# Patient Record
Sex: Female | Born: 1965
Health system: Southern US, Community
[De-identification: ages and names within clinical notes are randomized; demographics above are authoritative.]

## PROBLEM LIST (undated history)

## (undated) DIAGNOSIS — E669 Obesity, unspecified: Secondary | ICD-10-CM

## (undated) DIAGNOSIS — D649 Anemia, unspecified: Secondary | ICD-10-CM

## (undated) DIAGNOSIS — T8859XA Other complications of anesthesia, initial encounter: Secondary | ICD-10-CM

## (undated) DIAGNOSIS — I499 Cardiac arrhythmia, unspecified: Secondary | ICD-10-CM

## (undated) DIAGNOSIS — M1990 Unspecified osteoarthritis, unspecified site: Secondary | ICD-10-CM

## (undated) DIAGNOSIS — T4145XA Adverse effect of unspecified anesthetic, initial encounter: Secondary | ICD-10-CM

## (undated) DIAGNOSIS — K219 Gastro-esophageal reflux disease without esophagitis: Secondary | ICD-10-CM

## (undated) DIAGNOSIS — I493 Ventricular premature depolarization: Secondary | ICD-10-CM

## (undated) HISTORY — PX: COLONOSCOPY: SHX174

## (undated) HISTORY — DX: Obesity, unspecified: E66.9

## (undated) HISTORY — DX: Ventricular premature depolarization: I49.3

---

## 2011-04-10 ENCOUNTER — Ambulatory Visit (HOSPITAL_BASED_OUTPATIENT_CLINIC_OR_DEPARTMENT_OTHER)
Admission: RE | Admit: 2011-04-10 | Discharge: 2011-04-10 | Disposition: A | Discharge status: Home / Self Care | Payer: BC Managed Care – PPO | Source: Ambulatory Visit | Attending: Family Medicine | Admitting: Family Medicine

## 2011-04-10 ENCOUNTER — Other Ambulatory Visit (HOSPITAL_BASED_OUTPATIENT_CLINIC_OR_DEPARTMENT_OTHER): Payer: Self-pay | Admitting: Family Medicine

## 2011-04-10 DIAGNOSIS — M79609 Pain in unspecified limb: Secondary | ICD-10-CM

## 2011-04-10 DIAGNOSIS — M79604 Pain in right leg: Secondary | ICD-10-CM

## 2011-04-10 DIAGNOSIS — Z79899 Other long term (current) drug therapy: Secondary | ICD-10-CM | POA: Insufficient documentation

## 2012-07-24 ENCOUNTER — Other Ambulatory Visit: Payer: Self-pay | Admitting: Sports Medicine

## 2012-07-24 ENCOUNTER — Ambulatory Visit (INDEPENDENT_AMBULATORY_CARE_PROVIDER_SITE_OTHER): Payer: BC Managed Care – PPO

## 2012-07-24 DIAGNOSIS — M948X9 Other specified disorders of cartilage, unspecified sites: Secondary | ICD-10-CM

## 2012-07-24 DIAGNOSIS — T148XXA Other injury of unspecified body region, initial encounter: Secondary | ICD-10-CM

## 2012-07-24 DIAGNOSIS — M25569 Pain in unspecified knee: Secondary | ICD-10-CM

## 2016-04-10 ENCOUNTER — Telehealth: Payer: Self-pay | Admitting: Cardiovascular Disease

## 2016-04-10 NOTE — Telephone Encounter (Signed)
Received records from [facility] for appointment with Dr Kirke CorinArida on 04/25/16.  [facility] for Dr Jari SportsmanArida's schedule on 04/25/16. lp

## 2016-04-25 ENCOUNTER — Encounter: Payer: Self-pay | Admitting: Cardiovascular Disease

## 2016-04-25 ENCOUNTER — Ambulatory Visit (INDEPENDENT_AMBULATORY_CARE_PROVIDER_SITE_OTHER): Payer: BLUE CROSS/BLUE SHIELD | Admitting: Cardiovascular Disease

## 2016-04-25 VITALS — BP 105/68 | HR 68 | Ht 66.0 in | Wt 252.0 lb

## 2016-04-25 DIAGNOSIS — R609 Edema, unspecified: Secondary | ICD-10-CM | POA: Diagnosis not present

## 2016-04-25 DIAGNOSIS — R002 Palpitations: Secondary | ICD-10-CM

## 2016-04-25 DIAGNOSIS — I493 Ventricular premature depolarization: Secondary | ICD-10-CM

## 2016-04-25 MED ORDER — DILTIAZEM HCL ER COATED BEADS 120 MG PO CP24: 120.0000 mg | ORAL_CAPSULE | Freq: Every day | ORAL | Status: DC

## 2016-04-25 NOTE — Patient Instructions (Signed)
Medication Instructions:  Your physician has recommended you make the following change in your medication:  1. STOP long acting Propranolol, Continue as needed short acting Propranolol 2. START Diltiazem ER 120 mg take one capsule by mouth daily  Labwork: No new orders.   Testing/Procedures: Your physician has requested that you have an echocardiogram. Echocardiography is a painless test that uses sound waves to create images of your heart. It provides your doctor with information about the size and shape of your heart and how well your heart's chambers and valves are working. This procedure takes approximately one hour. There are no restrictions for this procedure.  Your physician has recommended that you wear a 48 hour holter monitor. Holter monitors are medical devices that record the heart's electrical activity. Doctors most often use these monitors to diagnose arrhythmias. Arrhythmias are problems with the speed or rhythm of the heartbeat. The monitor is a small, portable device. You can wear one while you do your normal daily activities. This is usually used to diagnose what is causing palpitations/syncope (passing out).  Follow-Up: Your physician recommends that you schedule a follow-up appointment as needed with Dr Kirke CorinArida.   Any Other Special Instructions Will Be Listed Below (If Applicable).     If you need a refill on your cardiac medications before your next appointment, please call your pharmacy.

## 2016-04-25 NOTE — Progress Notes (Signed)
Cardiology Office Note   Date:  04/25/2016   ID:  Dorothy Gibson, DOB 1966-05-05, MRN [id number]  PCP:  Dorothy Rua, MD  Cardiologist:   Dorothy Bears, MD   Chief Complaint  Patient presents with  . New Patient (Initial Visit)    SOB;none. CHEST PAIN;none. LIGHTHEADED/DIZZINESS;none. PAIN OR CRAMPING IN LEGS;none.EDEMA; ankles and hands      History of Present Illness: Dorothy Gibson is a 50 y.o. female who was referred by Dorothy Gibson for evaluation of PVCs, weight gain and leg edema. The patient has no significant chronic medical conditions other than obesity. She was diagnosed with PVCs few years ago. She was treated with metoprolol which did not control her symptoms. She was then treated with propranolol with subsequent improvement but the need to gradually increase the dose. She takes 160 mg extended release at night and 20 mg 3 times daily. Even with this regimen, she continues to complain of palpitations. She describes mild dyspnea but no chest pain, orthopnea or PND. Over the last few years, she has experienced gradual weight gain in spite of being more active and doing more exercise. She also complains of fatigue. There is no history of syncope or presyncope. She consumes minimal amounts of caffeine. There is no history of hyperthyroidism. She is not a smoker and there is no family history of coronary artery disease. Her father died of lung cancer.    History reviewed. No pertinent past medical history.  History reviewed. No pertinent past surgical history.   Current Outpatient Prescriptions  Medication Sig Dispense Refill  . cycloSPORINE (RESTASIS) 0.05 % ophthalmic emulsion Place 1 drop into both eyes 2 (two) times daily.    . Fish Oil-Cholecalciferol (FISH OIL + D3) 1200-1000 MG-UNIT CAPS Take by mouth daily.    . LO LOESTRIN FE 1 MG-10 MCG / 10 MCG tablet Take 1 tablet by mouth daily as needed.  1  . Probiotic Product (PRO-BIOTIC BLEND) CAPS Take by mouth  daily.    . propranolol (INDERAL) 20 MG tablet Take 1 tablet by mouth 3 (three) times daily after meals.  5  . diltiazem (CARDIZEM CD) 120 MG 24 hr capsule Take 1 capsule (120 mg total) by mouth daily. 30 capsule 6   No current facility-administered medications for this visit.    Allergies:   Ivp dye; Neosporin; Feldene; and Latex    Social History:  The patient  reports that she has never smoked. She does not have any smokeless tobacco history on file.   Family History:  The patient's Family history is negative for coronary artery disease. Father died of lung cancer  ROS:  Please see the history of present illness.   Otherwise, review of systems are positive for none.   All other systems are reviewed and negative.    PHYSICAL EXAM: VS:  BP 105/68 mmHg  Pulse 68  Ht  (1.676 m)  Wt 252 lb (114.306 kg)  BMI 40.69 kg/m2 , BMI Body mass index is 40.69 kg/(m^2). GEN: Well nourished, well developed, in no acute distress HEENT: normal Neck: no JVD, carotid bruits, or masses Cardiac: RRR; no murmurs, rubs, or gallops,no edema  Respiratory:  clear to auscultation bilaterally, normal work of breathing GI: soft, nontender, nondistended, + BS MS: no deformity or atrophy Skin: warm and dry, no rash Neuro:  Strength and sensation are intact Psych: euthymic mood, full affect   EKG:  EKG is ordered today. The ekg ordered today demonstrates normal sinus  rhythm with no significant ST or T wave changes.   Recent Labs: No results found for requested labs within last 365 days.    Lipid Panel No results found for: CHOL, TRIG, HDL, CHOLHDL, VLDL, LDLCALC, LDLDIRECT    Wt Readings from Last 3 Encounters:  04/25/16 252 lb (114.306 kg)        ASSESSMENT AND PLAN:  1.  Symptomatic PVCs: By physical exam, she has no premature beats and baseline ECG is also normal. However she continues to complain of frequent palpitations especially during the day. There seems to be an anxiety  component and I reassured her that if the burden of PVCs is low and LV systolic function is normal then these are overall benign. I requested a 48-hour Holter monitor to quantify the PVCs. I also requested an echocardiogram to evaluate LV systolic function and any structural heart abnormalities.  Treatment with beta blockers can be associated with weight gain which she is complaining off. Thus, I elected to switch propranolol to diltiazem extended release 120 mg once daily. She can continue to use short acting propranolol 20 mg as needed throughout the day.  2. Leg edema: No significant edema by exam today. We'll check LV systolic function and pulmonary pressure by echo.      Disposition:   FU with me as needed  Signed,  Dorothy BearsMuhammad Arida, MD  04/25/2016 1:09 PM    [facility]

## 2016-04-26 ENCOUNTER — Telehealth: Payer: Self-pay

## 2016-04-26 ENCOUNTER — Telehealth: Payer: Self-pay | Admitting: Cardiovascular Disease

## 2016-04-26 NOTE — Telephone Encounter (Signed)
Spoke with Dorothy Gibson and she states that she took the Diltiazem 120mg  last night but did not take any Propanolol. Dorothy Gibson had PVC's last night and this morning. Dorothy Gibson wanted to know if ok to take a Propanolol along with the Diltiazem if having the PVC's.   Advised Dorothy Gibson that would be fine. Dorothy Gibson also wanted to know if Dr. Kirke CorinArida felt that she needed a higher dose of the Diltiazem since she had the PVC's? Advised Dorothy Gibson that since this is a new medication he will probably want her to continue at this dose and see how she does over a period of time. Dorothy Gibson asked that I please verify with Dr. Kirke CorinArida. Also advised Dorothy Gibson that tylenol is preferred over ibuprofen. Dorothy Gibson verbalized understanding. Will route to Dr. Kirke CorinArida for review and advisement.

## 2016-04-26 NOTE — Telephone Encounter (Signed)
We need at least few days to evaluate the response before increasing the dose. We also need to see what the Holter monitor shows.

## 2016-04-26 NOTE — Telephone Encounter (Signed)
Patient was in today and states her insurance company needs a "predetermination" code for her upcoming Holter monitor.  Please call [employer] [phone] with this information.  This insurance company states they tried to reach Dorothy Gibson (no last name) but were unsuccessful.

## 2016-04-26 NOTE — Telephone Encounter (Signed)
New message   Pt C/O medication issue:  1. Name of Medication: diltiazem 120 mg   2. How are you currently taking this medication (dosage and times per day)? Started new medication    3. Are you having a reaction (difficulty breathing--STAT)? No   4. What is your medication issue? Wants to know can she take propranolol 20 mg at night with diltiazem 120 mg  - last night  PVC just took  one 120 mg  / woke up this am with PVC.    Patient also wants to know about Ibuprofen and tylenol

## 2016-04-27 NOTE — Telephone Encounter (Signed)
#   for [employer] is the generic Member line.  I called the patient's cell phone and gave her [phone] and [phone] for a 48 hour holter monitor. I also gave her the ICD10 for PCVS which is what I think they need to look at [facility] medical necessity list.  We don't have codes for "pre determination"  She was appreciative and I gave her my direct # incase she needed anything else.

## 2016-04-27 NOTE — Telephone Encounter (Signed)
I spoke with the pt and made her aware of Dr Kirke CorinArida' comments.  The pt said she took her Diltiazem last night and did not have any palpitations. I advised her to give the medication time to get in her system and then we can make adjustments if she continues to have symptoms. Pt agreed with plan.

## 2016-04-27 NOTE — Telephone Encounter (Signed)
This message will be forwarded to our Billing/Precert department to address.

## 2016-05-11 ENCOUNTER — Other Ambulatory Visit (HOSPITAL_COMMUNITY): Payer: BLUE CROSS/BLUE SHIELD

## 2016-05-16 ENCOUNTER — Ambulatory Visit (HOSPITAL_COMMUNITY): Payer: BLUE CROSS/BLUE SHIELD | Attending: Cardiovascular Disease

## 2016-05-16 ENCOUNTER — Ambulatory Visit (INDEPENDENT_AMBULATORY_CARE_PROVIDER_SITE_OTHER): Payer: BLUE CROSS/BLUE SHIELD

## 2016-05-16 ENCOUNTER — Other Ambulatory Visit: Payer: Self-pay

## 2016-05-16 DIAGNOSIS — I517 Cardiomegaly: Secondary | ICD-10-CM | POA: Insufficient documentation

## 2016-05-16 DIAGNOSIS — R609 Edema, unspecified: Secondary | ICD-10-CM | POA: Insufficient documentation

## 2016-05-16 DIAGNOSIS — I493 Ventricular premature depolarization: Secondary | ICD-10-CM

## 2016-05-16 DIAGNOSIS — E669 Obesity, unspecified: Secondary | ICD-10-CM | POA: Diagnosis not present

## 2016-05-16 DIAGNOSIS — I313 Pericardial effusion (noninflammatory): Secondary | ICD-10-CM | POA: Insufficient documentation

## 2016-05-16 DIAGNOSIS — Z6841 Body Mass Index (BMI) 40.0 and over, adult: Secondary | ICD-10-CM | POA: Diagnosis not present

## 2016-05-16 DIAGNOSIS — R002 Palpitations: Secondary | ICD-10-CM | POA: Diagnosis not present

## 2016-05-18 ENCOUNTER — Telehealth: Payer: Self-pay | Admitting: Cardiovascular Disease

## 2016-05-18 NOTE — Telephone Encounter (Signed)
Follow-up     The pt is calling back to get Echo results, and making sure if the pt still needs to meet with the doctor next week.

## 2016-05-18 NOTE — Telephone Encounter (Signed)
Spoke with pt and went over echo results. Pt wanted to know if she should keep her appt. Advised her f/u was as needed so I could cancel if she wanted me to. Pt currently wearing 48 hr holter with plan to remove today.  Pt wanted to cancel appt for 6/6 and see what monitor results showed and make an appt at that time if it was necessary. Cancelled appt and advised pt we will contact her with those results once available. Pt verbalized understanding and was in agreement with this plan.

## 2016-05-23 ENCOUNTER — Ambulatory Visit: Payer: BLUE CROSS/BLUE SHIELD | Admitting: Cardiovascular Disease

## 2016-05-26 ENCOUNTER — Telehealth: Payer: Self-pay | Admitting: Cardiovascular Disease

## 2016-05-26 DIAGNOSIS — I493 Ventricular premature depolarization: Secondary | ICD-10-CM

## 2016-05-26 DIAGNOSIS — R609 Edema, unspecified: Secondary | ICD-10-CM

## 2016-05-26 NOTE — Telephone Encounter (Signed)
Left message on machine for pt to contact the office.   

## 2016-05-26 NOTE — Telephone Encounter (Signed)
Mrs. Dorothy Gibson is calling about her holter monitor results and she has had two episodes of her heart feeling heavy and like she is going to pass out . Please call

## 2016-05-29 NOTE — Telephone Encounter (Signed)
See Holter result note.  

## 2016-05-30 MED ORDER — DILTIAZEM HCL ER COATED BEADS 180 MG PO CP24: 180.0000 mg | ORAL_CAPSULE | Freq: Every day | ORAL | Status: DC

## 2016-05-30 NOTE — Telephone Encounter (Signed)
Reviewed results of holter monitor at lenght with pt.  She is aware that further testing is needed to r/o ischemia.  Reviewed the myoview instructions and orders to increase Diltiazem to 180 mg.  Answered multiple questions for pt and she seemed to feel more at ease after our conversation.  She is aware someone will be calling to schedule her stress test and f/u appt.

## 2016-05-30 NOTE — Telephone Encounter (Signed)
Reviewed with Dr.Arida and pt should hold Diltiazem and prn Propanolol the day of stress test. I placed call to pt and left message to call back

## 2016-05-31 ENCOUNTER — Telehealth: Payer: Self-pay | Admitting: *Deleted

## 2016-05-31 NOTE — Telephone Encounter (Signed)
Patient walk-in to office Patient  had question  In regards to taking  The propanolol prn ,if the diltiazem had been increased to 180 from 120 mg Also had question concerning 2 day stress test.  RN informed continue to use if needed - symptoms lasting more than 3 -6 hours, not if she has fleeting palpation We the increase medication she may not have the issue.  RN and Almira CoasterGina ( nuc tech.) answered her question concerning testing.  Informed patient will receive a call prior to test with more instruction  Patient verbalized understanding

## 2016-06-14 ENCOUNTER — Telehealth: Payer: Self-pay | Admitting: Cardiovascular Disease

## 2016-06-14 NOTE — Telephone Encounter (Signed)
New Message:   Pt says her primary doctor thinks she is having an allergic reaction to her Diltiazem,please call.

## 2016-06-14 NOTE — Telephone Encounter (Signed)
PT  CALLED  NOTES  ITCHING  RASH TO LIMBS,FACE AND  TRUNK   ALSO  HAS  BEEN CONSTIPATED the patient WAS  SEEN BY PRIMARY PA  WHOM THINKS  CULPRIT  IS  DILTIAZEM . PT    MEDS WERE CHANGED  PA  STOPPED  DILTIAZEM AND  RESTARTED  INDERAL  ER  160 MG  .DILTIAZEM WAS RECENTLY  INCREASED BASED  ON  FINDINGS OF HOLTER .WILL FORWARD TO  DR  Kirke CorinARIDA  FOR  REVIEW AND  RECOMMENDATIONS .Zack Seal/CY

## 2016-06-15 ENCOUNTER — Telehealth (HOSPITAL_COMMUNITY): Payer: Self-pay | Admitting: *Deleted

## 2016-06-15 NOTE — Telephone Encounter (Signed)
Patient given detailed instructions per Myocardial Perfusion Study Information Sheet for the test on 06/21/16 at 1230. Patient notified to arrive 15 minutes early and that it is imperative to arrive on time for appointment to keep from having the test rescheduled.  If you need to cancel or reschedule your appointment, please call the office within 24 hours of your appointment. Failure to do so may result in a cancellation of your appointment, and a $50 no show fee. Patient verbalized understanding.Hasspacher, Cynthia W     

## 2016-06-15 NOTE — Telephone Encounter (Signed)
I agree with switching back to Inderal due to reaction. I can reevaluate her during follow up.

## 2016-06-15 NOTE — Telephone Encounter (Signed)
Patient had multiple questions in reference to her allergic reaction to Diltiazem. As documented in note to C.York,LPN whom forwarded this onto Dr. Kirke CorinArida. Patient has not heard from Dr. Kirke CorinArida yet. Patient stated her rash is some better, no breathing issues. I informed patient to take Benadryl in place of Zyrtec. I informed patient to continue what her primary instructed her to do until she hears back from Dr. Kirke CorinArida office.Hasspacher, Adelene IdlerCynthia W

## 2016-06-16 NOTE — Telephone Encounter (Signed)
Patient stated rash much better

## 2016-06-16 NOTE — Telephone Encounter (Signed)
Follow-up    The pt was started on Propranolol    Pt c/o medication issue:  1. Name of Medication: Propranolol  2. How are you currently taking this medication (dosage and times per day)? 160 mg po daily  3. Are you having a reaction (difficulty breathing--STAT)? no  4. What is your medication issue? The pt wanted to know it is ok to continue to keeping taking the new medication cause she was taking diltiazem and she got a rash all over

## 2016-06-16 NOTE — Telephone Encounter (Signed)
Left message to call back  

## 2016-06-16 NOTE — Telephone Encounter (Signed)
Advised patient and she will continue Inderal 160 mg daily

## 2016-06-16 NOTE — Telephone Encounter (Signed)
Keep follow up as scheduled.

## 2016-06-21 ENCOUNTER — Ambulatory Visit (HOSPITAL_COMMUNITY): Payer: BLUE CROSS/BLUE SHIELD | Attending: Cardiovascular Disease

## 2016-06-21 DIAGNOSIS — R002 Palpitations: Secondary | ICD-10-CM | POA: Insufficient documentation

## 2016-06-21 DIAGNOSIS — I493 Ventricular premature depolarization: Secondary | ICD-10-CM | POA: Insufficient documentation

## 2016-06-21 DIAGNOSIS — R0609 Other forms of dyspnea: Secondary | ICD-10-CM | POA: Insufficient documentation

## 2016-06-21 MED ORDER — TECHNETIUM TC 99M TETROFOSMIN IV KIT
32.7000 | PACK | Freq: Once | INTRAVENOUS | Status: AC | PRN
  Administered 2016-06-21: 32.7 via INTRAVENOUS
  Filled 2016-06-21: qty 33

## 2016-06-22 ENCOUNTER — Ambulatory Visit (HOSPITAL_COMMUNITY): Payer: BLUE CROSS/BLUE SHIELD | Attending: Internal Medicine

## 2016-06-22 LAB — MYOCARDIAL PERFUSION IMAGING
Estimated workload: 8.9 METS
Exercise duration (min): 7 min
Exercise duration (sec): 15 s
LV dias vol: 121 mL (ref 46–106)
LV sys vol: 55 mL
MPHR: 169 {beats}/min
Peak HR: 160 {beats}/min
Percent HR: 95 %
RATE: 0.41
RPE: 19
Rest HR: 68 {beats}/min
SDS: 7
SRS: 5
SSS: 12
TID: 0.87

## 2016-06-22 MED ORDER — TECHNETIUM TC 99M TETROFOSMIN IV KIT
32.2000 | PACK | Freq: Once | INTRAVENOUS | Status: AC | PRN
  Administered 2016-06-22: 32.2 via INTRAVENOUS
  Filled 2016-06-22: qty 32

## 2016-06-26 ENCOUNTER — Telehealth: Payer: Self-pay | Admitting: Cardiovascular Disease

## 2016-06-26 NOTE — Telephone Encounter (Signed)
Follow Up:  Returning call from this morning,she does know who called.

## 2016-06-26 NOTE — Telephone Encounter (Signed)
PT AWARE OF MYOVIEW RESULTS./CY 

## 2016-06-26 NOTE — Telephone Encounter (Signed)
F/U  Pt returning Dorothy Gibson's phone call. Please call.

## 2016-07-11 ENCOUNTER — Ambulatory Visit: Payer: BLUE CROSS/BLUE SHIELD | Admitting: Cardiovascular Disease

## 2016-08-15 ENCOUNTER — Encounter (INDEPENDENT_AMBULATORY_CARE_PROVIDER_SITE_OTHER): Payer: Self-pay

## 2016-08-15 ENCOUNTER — Ambulatory Visit (INDEPENDENT_AMBULATORY_CARE_PROVIDER_SITE_OTHER): Payer: BLUE CROSS/BLUE SHIELD | Admitting: Cardiovascular Disease

## 2016-08-15 VITALS — BP 104/68 | HR 70 | Ht 66.0 in | Wt 255.0 lb

## 2016-08-15 DIAGNOSIS — I493 Ventricular premature depolarization: Secondary | ICD-10-CM | POA: Diagnosis not present

## 2016-08-15 NOTE — Progress Notes (Signed)
Cardiology Office Note   Date:  08/15/2016   ID:  Dorothy Gibson, DOB [birth year], MRN [id number]  PCP:  Ethel Rana  Cardiologist:   Lorine Bears, MD   No chief complaint on file.     History of Present Illness: Dorothy Gibson is a 50 y.o. female who is here today for a follow-up visit regarding palpitations due to PVCs and PACs.  The patient has no significant chronic medical conditions other than obesity. She was diagnosed with PVCs few years ago. She was treated with metoprolol which did not control her symptoms. She was then treated with propranolol with subsequent improvement but the need to gradually increase the dose. She takes 160 mg extended release at night and 20 mg 3 times daily.  I proceeded with an echocardiogram in May which showed an ejection fraction of 50-55%, normal diastolic function and trivial pericardial effusion. Holter monitor showed occasional PVCs and PACs. She had a total of 155 beats in 48 hours of presenting 0.1%. There was a 7 beat run of slow ventricular tachycardia slightly above 100 bpm. There was also mild PACs. I proceeded with a nuclear stress test which showed an ejection fraction of 55% with no evidence of perfusion defects. Initially she was switched from propranolol to diltiazem due to concerns about weight gain. However, she developed a rash with diltiazem and was placed back on propranolol. She reports that her symptoms are overall better than before.   Past Medical History:  Diagnosis Date  . Obesity   . PVC's (premature ventricular contractions)     No past surgical history on file.   Current Outpatient Prescriptions  Medication Sig Dispense Refill  . cycloSPORINE (RESTASIS) 0.05 % ophthalmic emulsion Place 1 drop into both eyes 2 (two) times daily.    . Fish Oil-Cholecalciferol (FISH OIL + D3) 1200-1000 MG-UNIT CAPS Take by mouth daily.    . LO LOESTRIN FE 1 MG-10 MCG / 10 MCG tablet Take 1 tablet by mouth daily as  needed.  1  . Probiotic Product (PRO-BIOTIC BLEND) CAPS Take by mouth daily.    . propranolol (INDERAL) 20 MG tablet Take 20 mg by mouth as needed.    . propranolol ER (INDERAL LA) 160 MG SR capsule Take 160 mg by mouth daily.     No current facility-administered medications for this visit.     Allergies:   Diltiazem; Ivp dye [iodinated diagnostic agents]; Neosporin [neomycin-bacitracin zn-polymyx]; Feldene [piroxicam]; and Latex    Social History:  The patient  reports that she has never smoked. She does not have any smokeless tobacco history on file.   Family History:  The patient's Family history is negative for coronary artery disease. Father died of lung cancer  ROS:  Please see the history of present illness.   Otherwise, review of systems are positive for none.   All other systems are reviewed and negative.    PHYSICAL EXAM: VS:  BP 104/68   Pulse 70   Ht 5\' 6"  (1.676 m)   Wt 255 lb (115.7 kg)   BMI 41.16 kg/m  , BMI Body mass index is 41.16 kg/m. GEN: Well nourished, well developed, in no acute distress HEENT: normal Neck: no JVD, carotid bruits, or masses Cardiac: RRR; no murmurs, rubs, or gallops,no edema  Respiratory:  clear to auscultation bilaterally, normal work of breathing GI: soft, nontender, nondistended, + BS MS: no deformity or atrophy Skin: warm and dry, no rash Neuro:  Strength and sensation are  intact Psych: euthymic mood, full affect   EKG:  EKG is ordered today. The ekg ordered today demonstrates normal sinus rhythm with no significant ST or T wave changes.   Recent Labs: No results found for requested labs within last 8760 hours.    Lipid Panel No results found for: CHOL, TRIG, HDL, CHOLHDL, VLDL, LDLCALC, LDLDIRECT    Wt Readings from Last 3 Encounters:  08/15/16 255 lb (115.7 kg)  06/21/16 252 lb (114.3 kg)  04/25/16 252 lb (114.3 kg)        ASSESSMENT AND PLAN:  1.  Symptomatic PVCs:  The patient was found to have mild PVCs and  PACs overall not significant enough to be worrisome. Her symptoms are reasonably controlled with propranolol. She had a rash with diltiazem. Echocardiogram showed no structural heart disease and stress test showed no evidence of ischemia.  2. Leg edema: This has resolved.     Disposition:   FU with me as needed  Signed,  Lorine BearsMuhammad Arida, MD  08/15/2016 5:47 PM    [facility]

## 2016-08-15 NOTE — Patient Instructions (Signed)

## 2016-11-06 DIAGNOSIS — E669 Obesity, unspecified: Secondary | ICD-10-CM | POA: Diagnosis not present

## 2016-11-27 DIAGNOSIS — Z6841 Body Mass Index (BMI) 40.0 and over, adult: Secondary | ICD-10-CM | POA: Diagnosis not present

## 2016-11-27 DIAGNOSIS — Z01419 Encounter for gynecological examination (general) (routine) without abnormal findings: Secondary | ICD-10-CM | POA: Diagnosis not present

## 2016-12-20 DIAGNOSIS — R609 Edema, unspecified: Secondary | ICD-10-CM | POA: Diagnosis not present

## 2017-01-06 DIAGNOSIS — Z1231 Encounter for screening mammogram for malignant neoplasm of breast: Secondary | ICD-10-CM | POA: Diagnosis not present

## 2017-01-22 DIAGNOSIS — E669 Obesity, unspecified: Secondary | ICD-10-CM | POA: Diagnosis not present

## 2017-01-23 DIAGNOSIS — R12 Heartburn: Secondary | ICD-10-CM | POA: Diagnosis not present

## 2017-01-23 DIAGNOSIS — R609 Edema, unspecified: Secondary | ICD-10-CM | POA: Diagnosis not present

## 2017-04-10 DIAGNOSIS — E669 Obesity, unspecified: Secondary | ICD-10-CM | POA: Diagnosis not present

## 2017-04-25 DIAGNOSIS — M25571 Pain in right ankle and joints of right foot: Secondary | ICD-10-CM | POA: Diagnosis not present

## 2017-04-25 DIAGNOSIS — R609 Edema, unspecified: Secondary | ICD-10-CM | POA: Diagnosis not present

## 2017-04-25 DIAGNOSIS — M25572 Pain in left ankle and joints of left foot: Secondary | ICD-10-CM | POA: Diagnosis not present

## 2017-04-25 DIAGNOSIS — K219 Gastro-esophageal reflux disease without esophagitis: Secondary | ICD-10-CM | POA: Diagnosis not present

## 2017-05-03 DIAGNOSIS — M722 Plantar fascial fibromatosis: Secondary | ICD-10-CM | POA: Diagnosis not present

## 2017-05-10 DIAGNOSIS — M722 Plantar fascial fibromatosis: Secondary | ICD-10-CM | POA: Diagnosis not present

## 2017-06-12 DIAGNOSIS — E669 Obesity, unspecified: Secondary | ICD-10-CM | POA: Diagnosis not present

## 2017-07-17 DIAGNOSIS — B379 Candidiasis, unspecified: Secondary | ICD-10-CM | POA: Diagnosis not present

## 2017-07-17 DIAGNOSIS — H6123 Impacted cerumen, bilateral: Secondary | ICD-10-CM | POA: Diagnosis not present

## 2017-11-22 DIAGNOSIS — R194 Change in bowel habit: Secondary | ICD-10-CM | POA: Diagnosis not present

## 2017-11-22 DIAGNOSIS — M549 Dorsalgia, unspecified: Secondary | ICD-10-CM | POA: Diagnosis not present

## 2017-11-22 DIAGNOSIS — K625 Hemorrhage of anus and rectum: Secondary | ICD-10-CM | POA: Diagnosis not present

## 2017-11-22 DIAGNOSIS — K649 Unspecified hemorrhoids: Secondary | ICD-10-CM | POA: Diagnosis not present

## 2017-11-23 DIAGNOSIS — R194 Change in bowel habit: Secondary | ICD-10-CM | POA: Diagnosis not present

## 2018-01-02 DIAGNOSIS — M545 Low back pain: Secondary | ICD-10-CM | POA: Diagnosis not present

## 2018-01-02 DIAGNOSIS — M5126 Other intervertebral disc displacement, lumbar region: Secondary | ICD-10-CM | POA: Diagnosis not present

## 2018-01-07 DIAGNOSIS — R300 Dysuria: Secondary | ICD-10-CM | POA: Diagnosis not present

## 2018-01-07 DIAGNOSIS — Z01411 Encounter for gynecological examination (general) (routine) with abnormal findings: Secondary | ICD-10-CM | POA: Diagnosis not present

## 2018-01-07 DIAGNOSIS — Z01419 Encounter for gynecological examination (general) (routine) without abnormal findings: Secondary | ICD-10-CM | POA: Diagnosis not present

## 2018-01-07 DIAGNOSIS — Z6841 Body Mass Index (BMI) 40.0 and over, adult: Secondary | ICD-10-CM | POA: Diagnosis not present

## 2018-01-09 DIAGNOSIS — M5416 Radiculopathy, lumbar region: Secondary | ICD-10-CM | POA: Diagnosis not present

## 2018-01-09 DIAGNOSIS — M5126 Other intervertebral disc displacement, lumbar region: Secondary | ICD-10-CM | POA: Diagnosis not present

## 2018-01-10 DIAGNOSIS — M5416 Radiculopathy, lumbar region: Secondary | ICD-10-CM | POA: Diagnosis not present

## 2018-01-17 DIAGNOSIS — M5126 Other intervertebral disc displacement, lumbar region: Secondary | ICD-10-CM | POA: Diagnosis not present

## 2018-01-17 DIAGNOSIS — M5416 Radiculopathy, lumbar region: Secondary | ICD-10-CM | POA: Diagnosis not present

## 2018-01-21 DIAGNOSIS — Z1231 Encounter for screening mammogram for malignant neoplasm of breast: Secondary | ICD-10-CM | POA: Diagnosis not present

## 2018-02-11 DIAGNOSIS — M5416 Radiculopathy, lumbar region: Secondary | ICD-10-CM | POA: Diagnosis not present

## 2018-02-15 DIAGNOSIS — M5416 Radiculopathy, lumbar region: Secondary | ICD-10-CM | POA: Diagnosis not present

## 2018-02-19 DIAGNOSIS — M5416 Radiculopathy, lumbar region: Secondary | ICD-10-CM | POA: Diagnosis not present

## 2018-02-22 DIAGNOSIS — M5416 Radiculopathy, lumbar region: Secondary | ICD-10-CM | POA: Diagnosis not present

## 2018-03-04 DIAGNOSIS — B372 Candidiasis of skin and nail: Secondary | ICD-10-CM | POA: Diagnosis not present

## 2018-03-04 DIAGNOSIS — H6123 Impacted cerumen, bilateral: Secondary | ICD-10-CM | POA: Diagnosis not present

## 2018-03-07 ENCOUNTER — Ambulatory Visit: Payer: Self-pay | Admitting: Orthopedic Surgery

## 2018-03-11 DIAGNOSIS — H6123 Impacted cerumen, bilateral: Secondary | ICD-10-CM | POA: Diagnosis not present

## 2018-03-11 DIAGNOSIS — H9190 Unspecified hearing loss, unspecified ear: Secondary | ICD-10-CM | POA: Diagnosis not present

## 2018-03-11 DIAGNOSIS — I872 Venous insufficiency (chronic) (peripheral): Secondary | ICD-10-CM | POA: Diagnosis not present

## 2018-03-13 DIAGNOSIS — M5416 Radiculopathy, lumbar region: Secondary | ICD-10-CM | POA: Diagnosis not present

## 2018-03-13 DIAGNOSIS — M5126 Other intervertebral disc displacement, lumbar region: Secondary | ICD-10-CM | POA: Diagnosis not present

## 2018-03-20 ENCOUNTER — Encounter (HOSPITAL_COMMUNITY): Payer: Self-pay

## 2018-03-20 NOTE — Pre-Procedure Instructions (Signed)
Dorothy HeckLaura Jane Gibson  03/20/2018      [id number] - [place], [place] - [address] [place]  [id number] Phone: [phone] Fax: [phone]    Your procedure is scheduled on  Thursday 03/28/18  [facility].M.  Call this number if you have problems the morning of surgery:  [phone]   Remember:  Do not eat food or drink liquids after midnight.  Take these medicines the morning of surgery with A SIP OF WATER - GABAPENTIN (NEURONTIN), OMEPRAZOLE (PRILOSEC), PROPRANOLOL(inderal)  7 days prior to surgery STOP taking any Aspirin(unless otherwise instructed by your surgeon), Aleve, Naproxen, Ibuprofen, Motrin, Advil, Goody's, BC's, all herbal medications, fish oil, and all vitamins   Do not wear jewelry, make-up or nail polish.  Do not wear lotions, powders, or perfumes, or deodorant.  Do not shave 48 hours prior to surgery.  Men may shave face and neck.  Do not bring valuables to the hospital.  [facility] not [facility] for any belongings or valuables.  Contacts, dentures or bridgework may not be worn into surgery.  Leave your suitcase in the car.  After surgery it may be brought to your room.  For patients admitted to the hospital, discharge time will be determined by your treatment team.  Patients discharged the day of surgery will not be allowed to drive home.   Name and phone number of your driver:   Special instructions:  [employer] - Preparing for Surgery  Before surgery, you can play an important role.  Because skin is not sterile, your skin needs to be as free of germs as possible.  You can reduce the number of germs on you skin by washing with CHG (chlorahexidine gluconate) soap before surgery.  CHG is an antiseptic cleaner which kills germs and bonds with the skin to continue killing germs even after washing.  Please DO NOT use if you have an allergy to CHG or antibacterial  soaps.  If your skin becomes reddened/irritated stop using the CHG and inform your nurse when you arrive at Short Stay.  Do not shave (including legs and underarms) for at least 48 hours prior to the first CHG shower.  You may shave your face.  Please follow these instructions carefully:   1.  Shower with CHG Soap the night before surgery and the                                morning of Surgery.  2.  If you choose to wash your hair, wash your hair first as usual with your       normal shampoo.  3.  After you shampoo, rinse your hair and body thoroughly to remove the                      Shampoo.  4.  Use CHG as you would any other liquid soap.  You can apply chg directly       to the skin and wash gently with scrungie or a clean washcloth.  5.  Apply the CHG Soap to your body ONLY FROM THE NECK DOWN.        Do not use on open wounds or open sores.  Avoid contact with your eyes,       ears, mouth and genitals (private parts).  Wash genitals (private parts)       with your normal soap.  6.  Wash thoroughly, paying special attention to the area where your surgery        will be performed.  7.  Thoroughly rinse your body with warm water from the neck down.  8.  DO NOT shower/wash with your normal soap after using and rinsing off       the CHG Soap.  9.  Pat yourself dry with a clean towel.            10.  Wear clean pajamas.            11.  Place clean sheets on your bed the night of your first shower and do not        sleep with pets.  Day of Surgery  Do not apply any lotions/deoderants the morning of surgery.  Please wear clean clothes to the hospital/surgery center.     Please read over the following fact sheets that you were given. MRSA Information and Surgical Site Infection Prevention

## 2018-03-21 ENCOUNTER — Ambulatory Visit (HOSPITAL_COMMUNITY)
Admission: RE | Admit: 2018-03-21 | Discharge: 2018-03-21 | Disposition: A | Discharge status: Home / Self Care | Payer: BLUE CROSS/BLUE SHIELD | Source: Ambulatory Visit | Attending: Orthopedic Surgery | Admitting: Orthopedic Surgery

## 2018-03-21 ENCOUNTER — Other Ambulatory Visit: Payer: Self-pay

## 2018-03-21 ENCOUNTER — Encounter (HOSPITAL_COMMUNITY): Payer: Self-pay

## 2018-03-21 ENCOUNTER — Encounter (HOSPITAL_COMMUNITY)
Admission: RE | Admit: 2018-03-21 | Discharge: 2018-03-21 | Disposition: A | Discharge status: Home / Self Care | Payer: BLUE CROSS/BLUE SHIELD | Source: Ambulatory Visit | Attending: Specialist | Admitting: Specialist

## 2018-03-21 DIAGNOSIS — Z01818 Encounter for other preprocedural examination: Secondary | ICD-10-CM | POA: Diagnosis not present

## 2018-03-21 DIAGNOSIS — M47816 Spondylosis without myelopathy or radiculopathy, lumbar region: Secondary | ICD-10-CM | POA: Diagnosis not present

## 2018-03-21 DIAGNOSIS — Z01812 Encounter for preprocedural laboratory examination: Secondary | ICD-10-CM | POA: Diagnosis not present

## 2018-03-21 DIAGNOSIS — M5126 Other intervertebral disc displacement, lumbar region: Secondary | ICD-10-CM | POA: Insufficient documentation

## 2018-03-21 HISTORY — DX: Anemia, unspecified: D64.9

## 2018-03-21 HISTORY — DX: Gastro-esophageal reflux disease without esophagitis: K21.9

## 2018-03-21 HISTORY — DX: Unspecified osteoarthritis, unspecified site: M19.90

## 2018-03-21 HISTORY — DX: Other complications of anesthesia, initial encounter: T88.59XA

## 2018-03-21 HISTORY — DX: Cardiac arrhythmia, unspecified: I49.9

## 2018-03-21 HISTORY — DX: Adverse effect of unspecified anesthetic, initial encounter: T41.45XA

## 2018-03-21 LAB — CBC
HCT: 38.3 % (ref 36.0–46.0)
Hemoglobin: 12.3 g/dL (ref 12.0–15.0)
MCH: 31.2 pg (ref 26.0–34.0)
MCHC: 32.1 g/dL (ref 30.0–36.0)
MCV: 97.2 fL (ref 78.0–100.0)
Platelets: 281 10*3/uL (ref 150–400)
RBC: 3.94 MIL/uL (ref 3.87–5.11)
RDW: 13.3 % (ref 11.5–15.5)
WBC: 7 10*3/uL (ref 4.0–10.5)

## 2018-03-21 LAB — SURGICAL PCR SCREEN
MRSA, PCR: NEGATIVE
Staphylococcus aureus: NEGATIVE

## 2018-03-21 LAB — BASIC METABOLIC PANEL
Anion gap: 10 (ref 5–15)
BUN: 14 mg/dL (ref 6–20)
CO2: 24 mmol/L (ref 22–32)
Calcium: 9.5 mg/dL (ref 8.9–10.3)
Chloride: 105 mmol/L (ref 101–111)
Creatinine, Ser: 0.81 mg/dL (ref 0.44–1.00)
GFR calc Af Amer: >60 mL/min (ref >60)
GFR calc non Af Amer: >60 mL/min (ref >60)
Glucose, Bld: 108 mg/dL — ABNORMAL HIGH (ref 65–99)
Potassium: 3.8 mmol/L (ref 3.5–5.1)
Sodium: 139 mmol/L (ref 135–145)

## 2018-03-21 NOTE — Progress Notes (Signed)
PCP is Dr. Hamilton Caprihao Le Cardiologist is Dr. Kirke CorinArida  Denies chest pain, cough, or fever. Echo noted 05-16-16  stress noted 06-22-16 Denies ever having a card cath.

## 2018-03-25 ENCOUNTER — Ambulatory Visit: Payer: Self-pay | Admitting: Orthopedic Surgery

## 2018-03-25 NOTE — H&P (Signed)
Dorothy Gibson is an 52 y.o. female.   Chief Complaint: back and left leg pain HPI: Patient reports lower back pain on the left and leg pain on the left, __, __. She reports pain level 6/10. She reports constant. She reports sharp. She reports sitting for 10 min. She reports rest/sitting down/lying down (helps the pain) and NSAIDS (slight relief with Diclofenac). patient is scheduled for decompression of L5-S1 on 03/28/18.  Past Medical History:  Diagnosis Date  . Anemia   . Arthritis   . Complication of anesthesia    broke out with laughing gas   . Dysrhythmia    pvc's adn Avc's  . GERD (gastroesophageal reflux disease)   . Obesity   . PVC's (premature ventricular contractions)     Past Surgical History:  Procedure Laterality Date  . CESAREAN SECTION  1996 1999  . COLONOSCOPY      Family History  Problem Relation Age of Onset  . Lung cancer Father    Social History:  reports that she has never smoked. She has never used smokeless tobacco. She reports that she drank alcohol. She reports that she does not use drugs.  Smoking Status: Never smoker Non-smoker Chewing tobacco: none Alcohol intake: Occasional Hand Dominance: Right Work related injury?: N Advance directive: Y Medical Power of Attorney: N  Allergies:  Allergies  Allergen Reactions  . Ivp Dye [Iodinated Diagnostic Agents] Anaphylaxis and Palpitations  . Other     Paper tape or bandaids causes skin redness/blotchiness Patient states she tolerates adhesive tape  . Diltiazem Rash and Other (See Comments)    Rash and constipation   . Feldene [Piroxicam] Rash  . Latex Rash  . Neosporin [Neomycin-Bacitracin Zn-Polymyx] Rash and Other (See Comments)    Red/puffiness/skin irritation.   Meds fluconazole 150 mg tablet gabapentin 300 mg capsule meloxicam 15 mg tablet nystatin 100,000 unit/gram topical cream nystatin-triamcinolone 100,000 unit/g-0.1 % topical cream propranolol ER 160 mg capsule,24  hr,extended release Restasis 0.05 % eye drops in a dropperette  Review of Systems  Constitutional: Negative.   HENT: Negative.   Eyes: Negative.   Respiratory: Negative.   Cardiovascular: Negative.   Gastrointestinal: Negative.   Genitourinary: Negative.   Musculoskeletal: Positive for back pain.  Skin: Negative.   Neurological: Positive for sensory change and weakness.  Psychiatric/Behavioral: Negative.     There were no vitals taken for this visit. Physical Exam  Constitutional: She is oriented to person, place, and time. She appears well-developed and well-nourished.  HENT:  Head: Normocephalic.  Eyes: Pupils are equal, round, and reactive to light.  Neck: Normal range of motion.  Cardiovascular: Normal rate.  Respiratory: Effort normal.  GI: Soft.  Musculoskeletal:  Gait and Station Appearance: ambulating with no assistive devices, antalgic gait  Constitutional General Appearance: healthy-appearing, distress (mild)  Psychiatric Mood and Affect: active and alert  Cardiovascular System Edema Right: none (Dorsalis and posterior tibial pulses 2+) Edema Left: none  Abdomen Inspection and Palpation: non-distended, no tenderness  Skin Inspection and palpation: no rash  Lumbar Spine Inspection: normal alignment Bony Palpation of the Lumbar Spine: (tender at lumbosacral junction.) Bony Palpation of the Right Hip: no tenderness of the greater trochanter, tenderness of the SI joint (Pelvis stable) Bony Palpation of the Left Hip: no tenderness of the greater trochanter, tenderness of the SI joint Soft Tissue Palpation on the Right: (No flank pain with percussion) Active Range of Motion: (limited flexion and extention)  Motor Strength L1 Motor Strength on the Right: hip flexion  iliopsoas 5/5 L1 Motor Strength on the Left: hip flexion iliopsoas 5/5 L2-L4 Motor Strength on the Right: knee extension quadriceps 5/5 L2-L4 Motor Strength on the Left: knee extension  quadriceps 5/5 L5 Motor Strength on the Right: ankle dorsiflexion tibialis anterior 5/5, great toe extension extensor hallucis longus 5/5 L5 Motor Strength on the Left: ankle dorsiflexion tibialis anterior 5/5, great toe extension extensor hallucis longus 5/5 S1 Motor Strength on the Right: plantar flexion gastrocnemius 5/5 S1 Motor Strength on the Left: plantar flexion gastrocnemius 5/5  Neurological System Knee Reflex Right: normal (2) Knee Reflex Left: normal (2) Ankle Reflex Right: normal (2) Ankle Reflex Left: normal (2) Babinski Reflex Right: plantar reflex absent Babinski Reflex Left: plantar reflex absent Sensation on the Right: normal distal extremities Sensation on the Left: normal distal extremities Special Tests on the Right: no clonus of the ankle/knee Special Tests on the Left: no clonus of the ankle/knee, seated straight leg raising test positive Diminished repetitive plantar flexion on the left. EHLs 4+/5 on left. Decreased Achilles reflex.  Neurological: She is alert and oriented to person, place, and time.     Assessment/Plan Patient demonstrates refractory S1 radiculopathy secondary to disc herniation. Despite rest activity modification injection and therapy  I had an extensive discussion with the patient concerning the pathology relevant anatomy and treatment options. At this point exhausting conservative treatment and in the presence of a neurologic deficit we discussed microlumbar decompression. I discussed the risks and benefits including bleeding, infection, DVT, PE, anesthetic complications, worsening in their symptoms, improvement in their symptoms, C SF leakage, epidural fibrosis, need for future surgeries such as revision discectomy and lumbar fusion. I also indicated that this is an operation to basically decompress the nerve root to allow recovery as opposed to fixing a herniated disc and that the incidence of recurrent chest disc herniation can approach 15%.  Also that nerve root recovery is variable and may not recover completely.  I discussed the operative course including overnight in the hospital. Immediate ambulation. Follow-up in 2 weeks for suture removal. 6 weeks until healing of the herniation followed by 6 weeks of reconditioning and strengthening of the core musculature. Also discussed the need to employ the concepts of disc pressure management and core motion following the surgery to minimize the risk of recurrent disc herniation. We will obtain preoperative clearance i if necessary and proceed accordingly.  Plan microlumbar decompression L5-S1 left  Dorothy SparkBISSELL, JACLYN M., PA-C for Dr. Shelle IronBeane 03/25/2018, 8:24 AM

## 2018-03-25 NOTE — H&P (View-Only) (Signed)
Dorothy Gibson is an 52 y.o. female.   Chief Complaint: back and left leg pain HPI: Patient reports lower back pain on the left and leg pain on the left, __, __. She reports pain level 6/10. She reports constant. She reports sharp. She reports sitting for 10 min. She reports rest/sitting down/lying down (helps the pain) and NSAIDS (slight relief with Diclofenac). patient is scheduled for decompression of L5-S1 on 03/28/18.  Past Medical History:  Diagnosis Date  . Anemia   . Arthritis   . Complication of anesthesia    broke out with laughing gas   . Dysrhythmia    pvc's adn Avc's  . GERD (gastroesophageal reflux disease)   . Obesity   . PVC's (premature ventricular contractions)     Past Surgical History:  Procedure Laterality Date  . CESAREAN SECTION  1996 1999  . COLONOSCOPY      Family History  Problem Relation Age of Onset  . Lung cancer Father    Social History:  reports that she has never smoked. She has never used smokeless tobacco. She reports that she drank alcohol. She reports that she does not use drugs.  Smoking Status: Never smoker Non-smoker Chewing tobacco: none Alcohol intake: Occasional Hand Dominance: Right Work related injury?: N Advance directive: Y Medical Power of Attorney: N  Allergies:  Allergies  Allergen Reactions  . Ivp Dye [Iodinated Diagnostic Agents] Anaphylaxis and Palpitations  . Other     Paper tape or bandaids causes skin redness/blotchiness Patient states she tolerates adhesive tape  . Diltiazem Rash and Other (See Comments)    Rash and constipation   . Feldene [Piroxicam] Rash  . Latex Rash  . Neosporin [Neomycin-Bacitracin Zn-Polymyx] Rash and Other (See Comments)    Red/puffiness/skin irritation.   Meds fluconazole 150 mg tablet gabapentin 300 mg capsule meloxicam 15 mg tablet nystatin 100,000 unit/gram topical cream nystatin-triamcinolone 100,000 unit/g-0.1 % topical cream propranolol ER 160 mg capsule,24  hr,extended release Restasis 0.05 % eye drops in a dropperette  Review of Systems  Constitutional: Negative.   HENT: Negative.   Eyes: Negative.   Respiratory: Negative.   Cardiovascular: Negative.   Gastrointestinal: Negative.   Genitourinary: Negative.   Musculoskeletal: Positive for back pain.  Skin: Negative.   Neurological: Positive for sensory change and weakness.  Psychiatric/Behavioral: Negative.     There were no vitals taken for this visit. Physical Exam  Constitutional: She is oriented to person, place, and time. She appears well-developed and well-nourished.  HENT:  Head: Normocephalic.  Eyes: Pupils are equal, round, and reactive to light.  Neck: Normal range of motion.  Cardiovascular: Normal rate.  Respiratory: Effort normal.  GI: Soft.  Musculoskeletal:  Gait and Station Appearance: ambulating with no assistive devices, antalgic gait  Constitutional General Appearance: healthy-appearing, distress (mild)  Psychiatric Mood and Affect: active and alert  Cardiovascular System Edema Right: none (Dorsalis and posterior tibial pulses 2+) Edema Left: none  Abdomen Inspection and Palpation: non-distended, no tenderness  Skin Inspection and palpation: no rash  Lumbar Spine Inspection: normal alignment Bony Palpation of the Lumbar Spine: (tender at lumbosacral junction.) Bony Palpation of the Right Hip: no tenderness of the greater trochanter, tenderness of the SI joint (Pelvis stable) Bony Palpation of the Left Hip: no tenderness of the greater trochanter, tenderness of the SI joint Soft Tissue Palpation on the Right: (No flank pain with percussion) Active Range of Motion: (limited flexion and extention)  Motor Strength L1 Motor Strength on the Right: hip flexion  iliopsoas 5/5 L1 Motor Strength on the Left: hip flexion iliopsoas 5/5 L2-L4 Motor Strength on the Right: knee extension quadriceps 5/5 L2-L4 Motor Strength on the Left: knee extension  quadriceps 5/5 L5 Motor Strength on the Right: ankle dorsiflexion tibialis anterior 5/5, great toe extension extensor hallucis longus 5/5 L5 Motor Strength on the Left: ankle dorsiflexion tibialis anterior 5/5, great toe extension extensor hallucis longus 5/5 S1 Motor Strength on the Right: plantar flexion gastrocnemius 5/5 S1 Motor Strength on the Left: plantar flexion gastrocnemius 5/5  Neurological System Knee Reflex Right: normal (2) Knee Reflex Left: normal (2) Ankle Reflex Right: normal (2) Ankle Reflex Left: normal (2) Babinski Reflex Right: plantar reflex absent Babinski Reflex Left: plantar reflex absent Sensation on the Right: normal distal extremities Sensation on the Left: normal distal extremities Special Tests on the Right: no clonus of the ankle/knee Special Tests on the Left: no clonus of the ankle/knee, seated straight leg raising test positive Diminished repetitive plantar flexion on the left. EHLs 4+/5 on left. Decreased Achilles reflex.  Neurological: She is alert and oriented to person, place, and time.     Assessment/Plan Patient demonstrates refractory S1 radiculopathy secondary to disc herniation. Despite rest activity modification injection and therapy  I had an extensive discussion with the patient concerning the pathology relevant anatomy and treatment options. At this point exhausting conservative treatment and in the presence of a neurologic deficit we discussed microlumbar decompression. I discussed the risks and benefits including bleeding, infection, DVT, PE, anesthetic complications, worsening in their symptoms, improvement in their symptoms, C SF leakage, epidural fibrosis, need for future surgeries such as revision discectomy and lumbar fusion. I also indicated that this is an operation to basically decompress the nerve root to allow recovery as opposed to fixing a herniated disc and that the incidence of recurrent chest disc herniation can approach 15%.  Also that nerve root recovery is variable and may not recover completely.  I discussed the operative course including overnight in the hospital. Immediate ambulation. Follow-up in 2 weeks for suture removal. 6 weeks until healing of the herniation followed by 6 weeks of reconditioning and strengthening of the core musculature. Also discussed the need to employ the concepts of disc pressure management and core motion following the surgery to minimize the risk of recurrent disc herniation. We will obtain preoperative clearance i if necessary and proceed accordingly.  Plan microlumbar decompression L5-S1 left  Dorothy Gibson, Dorothy M., PA-C for Dr. Shelle IronBeane 03/25/2018, 8:24 AM

## 2018-03-27 MED ORDER — ACETAMINOPHEN 10 MG/ML IV SOLN
1000.0000 mg | INTRAVENOUS | Status: AC
  Administered 2018-03-28: 1000 mg via INTRAVENOUS
  Filled 2018-03-27: qty 100

## 2018-03-27 MED ORDER — DEXTROSE 5 % IV SOLN
3.0000 g | INTRAVENOUS | Status: AC
  Administered 2018-03-28: 3 g via INTRAVENOUS
  Filled 2018-03-27: qty 3

## 2018-03-27 NOTE — Anesthesia Preprocedure Evaluation (Addendum)
Anesthesia Evaluation  Patient identified by MRN, date of birth, ID band Patient awake    Reviewed: Allergy & Precautions, NPO status , Patient's Chart, lab work & pertinent test results, reviewed documented beta blocker date and time   Airway Mallampati: III  TM Distance: >3 FB Neck ROM: Full    Dental  (+) Dental Advisory Given, Teeth Intact   Pulmonary neg pulmonary ROS,    Pulmonary exam normal breath sounds clear to auscultation       Cardiovascular Normal cardiovascular exam+ dysrhythmias  Rhythm:Regular Rate:Normal  '17 Exercise Stress - Nuclear stress EF: 55%. There was no ST segment deviation noted during stress. The study is normal. This is a low risk study.   Neuro/Psych negative neurological ROS  negative psych ROS   GI/Hepatic Neg liver ROS, GERD  Controlled and Medicated,  Endo/Other  negative endocrine ROSMorbid obesity  Renal/GU negative Renal ROS  negative genitourinary   Musculoskeletal  (+) Arthritis ,   Abdominal (+) + obese,   Peds  Hematology negative hematology ROS (+)   Anesthesia Other Findings   Reproductive/Obstetrics                            Anesthesia Physical Anesthesia Plan  ASA: III  Anesthesia Plan: General   Post-op Pain Management:    Induction: Intravenous  PONV Risk Score and Plan: 3 and Treatment may vary due to age or medical condition, Ondansetron, Dexamethasone and Midazolam  Airway Management Planned: Oral ETT  Additional Equipment: None  Intra-op Plan:   Post-operative Plan: Extubation in OR  Informed Consent: I have reviewed the patients History and Physical, chart, labs and discussed the procedure including the risks, benefits and alternatives for the proposed anesthesia with the patient or authorized representative who has indicated his/her understanding and acceptance.   Dental advisory given  Plan Discussed with: CRNA and  Anesthesiologist  Anesthesia Plan Comments:         Anesthesia Quick Evaluation

## 2018-03-28 ENCOUNTER — Ambulatory Visit (HOSPITAL_COMMUNITY): Payer: BLUE CROSS/BLUE SHIELD

## 2018-03-28 ENCOUNTER — Ambulatory Visit (HOSPITAL_COMMUNITY): Payer: BLUE CROSS/BLUE SHIELD | Admitting: Anesthesiology

## 2018-03-28 ENCOUNTER — Ambulatory Visit (HOSPITAL_COMMUNITY)
Admission: RE | Disposition: A | Discharge status: Home / Self Care | Payer: Self-pay | Source: Ambulatory Visit | Attending: Specialist

## 2018-03-28 ENCOUNTER — Other Ambulatory Visit: Payer: Self-pay

## 2018-03-28 ENCOUNTER — Encounter (HOSPITAL_COMMUNITY): Payer: Self-pay | Admitting: Certified Registered Nurse Anesthetist

## 2018-03-28 ENCOUNTER — Ambulatory Visit (HOSPITAL_COMMUNITY)
Admission: RE | Admit: 2018-03-28 | Discharge: 2018-03-29 | Disposition: A | Discharge status: Home / Self Care | Payer: BLUE CROSS/BLUE SHIELD | Source: Ambulatory Visit | Attending: Specialist | Admitting: Specialist

## 2018-03-28 DIAGNOSIS — M1990 Unspecified osteoarthritis, unspecified site: Secondary | ICD-10-CM | POA: Diagnosis not present

## 2018-03-28 DIAGNOSIS — M5127 Other intervertebral disc displacement, lumbosacral region: Secondary | ICD-10-CM | POA: Insufficient documentation

## 2018-03-28 DIAGNOSIS — M5126 Other intervertebral disc displacement, lumbar region: Secondary | ICD-10-CM | POA: Diagnosis not present

## 2018-03-28 DIAGNOSIS — M48061 Spinal stenosis, lumbar region without neurogenic claudication: Secondary | ICD-10-CM | POA: Diagnosis not present

## 2018-03-28 DIAGNOSIS — Z981 Arthrodesis status: Secondary | ICD-10-CM | POA: Diagnosis not present

## 2018-03-28 DIAGNOSIS — Z6841 Body Mass Index (BMI) 40.0 and over, adult: Secondary | ICD-10-CM | POA: Diagnosis not present

## 2018-03-28 DIAGNOSIS — Z79899 Other long term (current) drug therapy: Secondary | ICD-10-CM | POA: Diagnosis not present

## 2018-03-28 DIAGNOSIS — K219 Gastro-esophageal reflux disease without esophagitis: Secondary | ICD-10-CM | POA: Diagnosis not present

## 2018-03-28 DIAGNOSIS — Z791 Long term (current) use of non-steroidal anti-inflammatories (NSAID): Secondary | ICD-10-CM | POA: Insufficient documentation

## 2018-03-28 DIAGNOSIS — I499 Cardiac arrhythmia, unspecified: Secondary | ICD-10-CM | POA: Diagnosis not present

## 2018-03-28 DIAGNOSIS — M4807 Spinal stenosis, lumbosacral region: Secondary | ICD-10-CM | POA: Diagnosis not present

## 2018-03-28 DIAGNOSIS — Z419 Encounter for procedure for purposes other than remedying health state, unspecified: Secondary | ICD-10-CM

## 2018-03-28 HISTORY — PX: LUMBAR LAMINECTOMY/DECOMPRESSION MICRODISCECTOMY: SHX5026

## 2018-03-28 LAB — POCT PREGNANCY, URINE: Preg Test, Ur: NEGATIVE

## 2018-03-28 SURGERY — LUMBAR LAMINECTOMY/DECOMPRESSION MICRODISCECTOMY 1 LEVEL
Anesthesia: General | Site: Spine Lumbar | Laterality: Left

## 2018-03-28 MED ORDER — PROPRANOLOL HCL 20 MG PO TABS
20.0000 mg | ORAL_TABLET | Freq: Every day | ORAL | Status: DC | PRN
  Filled 2018-03-28: qty 1

## 2018-03-28 MED ORDER — ONDANSETRON HCL 4 MG PO TABS: 4.0000 mg | ORAL_TABLET | Freq: Four times a day (QID) | ORAL | Status: DC | PRN

## 2018-03-28 MED ORDER — ACETAMINOPHEN 650 MG RE SUPP
650.0000 mg | RECTAL | Status: DC | PRN
Start: 2018-03-28 — End: 2018-03-29

## 2018-03-28 MED ORDER — PROMETHAZINE HCL 25 MG/ML IJ SOLN
6.2500 mg | INTRAMUSCULAR | Status: DC | PRN
  Administered 2018-03-28: 6.25 mg via INTRAVENOUS

## 2018-03-28 MED ORDER — ALUM & MAG HYDROXIDE-SIMETH 200-200-20 MG/5ML PO SUSP
30.0000 mL | Freq: Four times a day (QID) | ORAL | Status: DC | PRN
  Administered 2018-03-28: 30 mL via ORAL
  Filled 2018-03-28: qty 30

## 2018-03-28 MED ORDER — GLYCOPYRROLATE 0.2 MG/ML IV SOSY
PREFILLED_SYRINGE | INTRAVENOUS | Status: DC | PRN
  Administered 2018-03-28: 0.8 mg via INTRAVENOUS

## 2018-03-28 MED ORDER — METHOCARBAMOL 500 MG PO TABS
500.0000 mg | ORAL_TABLET | Freq: Four times a day (QID) | ORAL | Status: DC | PRN
  Administered 2018-03-28 – 2018-03-29 (×4): 500 mg via ORAL
  Filled 2018-03-28 (×3): qty 1

## 2018-03-28 MED ORDER — BISACODYL 5 MG PO TBEC: 5.0000 mg | DELAYED_RELEASE_TABLET | Freq: Every day | ORAL | Status: DC | PRN

## 2018-03-28 MED ORDER — LACTATED RINGERS IV SOLN
INTRAVENOUS | Status: DC
  Administered 2018-03-28 (×2): via INTRAVENOUS

## 2018-03-28 MED ORDER — ZINC OXIDE 40 % EX OINT: TOPICAL_OINTMENT | Freq: Two times a day (BID) | CUTANEOUS | Status: DC | PRN

## 2018-03-28 MED ORDER — ADULT MULTIVITAMIN W/MINERALS CH
1.0000 | ORAL_TABLET | Freq: Every day | ORAL | Status: DC
  Administered 2018-03-29: 1 via ORAL
  Filled 2018-03-28: qty 1

## 2018-03-28 MED ORDER — GABAPENTIN 300 MG PO CAPS
300.0000 mg | ORAL_CAPSULE | Freq: Two times a day (BID) | ORAL | Status: DC
  Administered 2018-03-28 – 2018-03-29 (×2): 300 mg via ORAL
  Filled 2018-03-28 (×2): qty 1

## 2018-03-28 MED ORDER — PROPOFOL 10 MG/ML IV BOLUS
INTRAVENOUS | Status: DC | PRN
  Administered 2018-03-28: 20 mg via INTRAVENOUS
  Administered 2018-03-28: 180 mg via INTRAVENOUS

## 2018-03-28 MED ORDER — PROPOFOL 10 MG/ML IV BOLUS
INTRAVENOUS | Status: AC
Start: 2018-03-28 — End: 2018-03-28
  Filled 2018-03-28: qty 20

## 2018-03-28 MED ORDER — PHENOL 1.4 % MT LIQD: 1.0000 | OROMUCOSAL | Status: DC | PRN

## 2018-03-28 MED ORDER — B-12 2500 MCG SL SUBL: 2500.0000 ug | SUBLINGUAL_TABLET | Freq: Every day | SUBLINGUAL | Status: DC

## 2018-03-28 MED ORDER — LIDOCAINE 2% (20 MG/ML) 5 ML SYRINGE
INTRAMUSCULAR | Status: DC | PRN
  Administered 2018-03-28: 80 mg via INTRAVENOUS

## 2018-03-28 MED ORDER — PANTOPRAZOLE SODIUM 40 MG PO TBEC: 40.0000 mg | DELAYED_RELEASE_TABLET | Freq: Every day | ORAL | Status: DC

## 2018-03-28 MED ORDER — FENTANYL CITRATE (PF) 100 MCG/2ML IJ SOLN
INTRAMUSCULAR | Status: DC | PRN
  Administered 2018-03-28 (×2): 100 ug via INTRAVENOUS
  Administered 2018-03-28: 50 ug via INTRAVENOUS

## 2018-03-28 MED ORDER — ONDANSETRON HCL 4 MG/2ML IJ SOLN: 4.0000 mg | Freq: Four times a day (QID) | INTRAMUSCULAR | Status: DC | PRN

## 2018-03-28 MED ORDER — ROCURONIUM BROMIDE 10 MG/ML (PF) SYRINGE
PREFILLED_SYRINGE | INTRAVENOUS | Status: DC | PRN
  Administered 2018-03-28: 60 mg via INTRAVENOUS

## 2018-03-28 MED ORDER — OXYCODONE HCL 5 MG PO TABS
5.0000 mg | ORAL_TABLET | ORAL | Status: DC | PRN
  Administered 2018-03-29: 5 mg via ORAL
  Filled 2018-03-28: qty 1

## 2018-03-28 MED ORDER — NEOSTIGMINE METHYLSULFATE 5 MG/5ML IV SOSY
PREFILLED_SYRINGE | INTRAVENOUS | Status: AC
  Filled 2018-03-28: qty 5

## 2018-03-28 MED ORDER — MENTHOL 3 MG MT LOZG
1.0000 | LOZENGE | OROMUCOSAL | Status: DC | PRN
  Filled 2018-03-28: qty 9

## 2018-03-28 MED ORDER — METHOCARBAMOL 1000 MG/10ML IJ SOLN
500.0000 mg | Freq: Four times a day (QID) | INTRAVENOUS | Status: DC | PRN
  Filled 2018-03-28: qty 5

## 2018-03-28 MED ORDER — LIDOCAINE 2% (20 MG/ML) 5 ML SYRINGE
INTRAMUSCULAR | Status: AC
  Filled 2018-03-28: qty 5

## 2018-03-28 MED ORDER — OXYCODONE HCL 5 MG PO TABS
10.0000 mg | ORAL_TABLET | ORAL | Status: DC | PRN
  Administered 2018-03-28 – 2018-03-29 (×4): 10 mg via ORAL
  Filled 2018-03-28 (×4): qty 2

## 2018-03-28 MED ORDER — HYDROMORPHONE HCL 1 MG/ML IJ SOLN: 0.5000 mg | INTRAMUSCULAR | Status: DC | PRN

## 2018-03-28 MED ORDER — NEOSTIGMINE METHYLSULFATE 5 MG/5ML IV SOSY
PREFILLED_SYRINGE | INTRAVENOUS | Status: DC | PRN
  Administered 2018-03-28: 5 mg via INTRAVENOUS

## 2018-03-28 MED ORDER — BUPIVACAINE-EPINEPHRINE 0.5% -1:200000 IJ SOLN
INTRAMUSCULAR | Status: DC | PRN
  Administered 2018-03-28: 4 mL

## 2018-03-28 MED ORDER — PHENYLEPHRINE 40 MCG/ML (10ML) SYRINGE FOR IV PUSH (FOR BLOOD PRESSURE SUPPORT)
PREFILLED_SYRINGE | INTRAVENOUS | Status: DC | PRN
  Administered 2018-03-28: 40 ug via INTRAVENOUS

## 2018-03-28 MED ORDER — MAGNESIUM CITRATE PO SOLN
1.0000 | Freq: Once | ORAL | Status: AC | PRN
  Administered 2018-03-29: 1 via ORAL
  Filled 2018-03-28: qty 296

## 2018-03-28 MED ORDER — DOCUSATE SODIUM 100 MG PO CAPS
100.0000 mg | ORAL_CAPSULE | Freq: Two times a day (BID) | ORAL | Status: DC
  Administered 2018-03-28 – 2018-03-29 (×3): 100 mg via ORAL
  Filled 2018-03-28 (×3): qty 1

## 2018-03-28 MED ORDER — ONDANSETRON HCL 4 MG/2ML IJ SOLN
INTRAMUSCULAR | Status: DC | PRN
  Administered 2018-03-28: 4 mg via INTRAVENOUS

## 2018-03-28 MED ORDER — FENTANYL CITRATE (PF) 100 MCG/2ML IJ SOLN
25.0000 ug | INTRAMUSCULAR | Status: DC | PRN
  Administered 2018-03-28: 50 ug via INTRAVENOUS

## 2018-03-28 MED ORDER — THROMBIN 20000 UNITS EX SOLR
CUTANEOUS | Status: AC
  Filled 2018-03-28: qty 20000

## 2018-03-28 MED ORDER — MIDAZOLAM HCL 2 MG/2ML IJ SOLN
INTRAMUSCULAR | Status: AC
  Filled 2018-03-28: qty 2

## 2018-03-28 MED ORDER — PROPOFOL 10 MG/ML IV BOLUS
INTRAVENOUS | Status: AC
  Filled 2018-03-28: qty 20

## 2018-03-28 MED ORDER — CULTURELLE PO CAPS: 1.0000 | ORAL_CAPSULE | Freq: Every day | ORAL | Status: DC

## 2018-03-28 MED ORDER — ACETAMINOPHEN 325 MG PO TABS: 650.0000 mg | ORAL_TABLET | ORAL | Status: DC | PRN

## 2018-03-28 MED ORDER — EPHEDRINE SULFATE-NACL 50-0.9 MG/10ML-% IV SOSY
PREFILLED_SYRINGE | INTRAVENOUS | Status: DC | PRN
  Administered 2018-03-28 (×3): 5 mg via INTRAVENOUS

## 2018-03-28 MED ORDER — OXYCODONE HCL 5 MG PO TABS
5.0000 mg | ORAL_TABLET | Freq: Once | ORAL | Status: AC | PRN
  Administered 2018-03-28: 5 mg via ORAL

## 2018-03-28 MED ORDER — EPHEDRINE 5 MG/ML INJ
INTRAVENOUS | Status: AC
Start: 2018-03-28 — End: 2018-03-28
  Filled 2018-03-28: qty 10

## 2018-03-28 MED ORDER — RISAQUAD PO CAPS
1.0000 | ORAL_CAPSULE | Freq: Every day | ORAL | Status: DC
  Administered 2018-03-29: 1 via ORAL
  Filled 2018-03-28: qty 1

## 2018-03-28 MED ORDER — OXYCODONE HCL 5 MG PO TABS
ORAL_TABLET | ORAL | Status: AC
  Administered 2018-03-28: 5 mg via ORAL
  Filled 2018-03-28: qty 1

## 2018-03-28 MED ORDER — ROCURONIUM BROMIDE 10 MG/ML (PF) SYRINGE
PREFILLED_SYRINGE | INTRAVENOUS | Status: AC
  Filled 2018-03-28: qty 5

## 2018-03-28 MED ORDER — DEXAMETHASONE SODIUM PHOSPHATE 10 MG/ML IJ SOLN
INTRAMUSCULAR | Status: AC
  Filled 2018-03-28: qty 1

## 2018-03-28 MED ORDER — OXYCODONE HCL 5 MG/5ML PO SOLN: 5.0000 mg | Freq: Once | ORAL | Status: AC | PRN

## 2018-03-28 MED ORDER — 0.9 % SODIUM CHLORIDE (POUR BTL) OPTIME
TOPICAL | Status: DC | PRN
Start: 2018-03-28 — End: 2018-03-28
  Administered 2018-03-28: 1000 mL

## 2018-03-28 MED ORDER — FENTANYL CITRATE (PF) 250 MCG/5ML IJ SOLN
INTRAMUSCULAR | Status: AC
  Filled 2018-03-28: qty 5

## 2018-03-28 MED ORDER — FENTANYL CITRATE (PF) 100 MCG/2ML IJ SOLN
INTRAMUSCULAR | Status: AC
  Administered 2018-03-28: 50 ug via INTRAVENOUS
  Filled 2018-03-28: qty 2

## 2018-03-28 MED ORDER — CEFAZOLIN SODIUM-DEXTROSE 2-4 GM/100ML-% IV SOLN
2.0000 g | Freq: Three times a day (TID) | INTRAVENOUS | Status: AC
  Administered 2018-03-28: 2 g via INTRAVENOUS
  Filled 2018-03-28: qty 100

## 2018-03-28 MED ORDER — PROPRANOLOL HCL ER 160 MG PO CP24
160.0000 mg | ORAL_CAPSULE | Freq: Every day | ORAL | Status: DC
  Administered 2018-03-28: 160 mg via ORAL
  Filled 2018-03-28 (×2): qty 1

## 2018-03-28 MED ORDER — CYCLOSPORINE 0.05 % OP EMUL
1.0000 [drp] | Freq: Every day | OPHTHALMIC | Status: DC
  Administered 2018-03-28: 1 [drp] via OPHTHALMIC
  Filled 2018-03-28 (×2): qty 1

## 2018-03-28 MED ORDER — POLYETHYLENE GLYCOL 3350 17 G PO PACK: 17.0000 g | PACK | Freq: Every day | ORAL | Status: DC | PRN

## 2018-03-28 MED ORDER — SODIUM CHLORIDE 0.9 % IV SOLN
INTRAVENOUS | Status: DC | PRN
  Administered 2018-03-28: 500 mL

## 2018-03-28 MED ORDER — ZINC SULFATE 220 (50 ZN) MG PO CAPS
220.0000 mg | ORAL_CAPSULE | Freq: Every day | ORAL | Status: DC
  Administered 2018-03-29: 220 mg via ORAL
  Filled 2018-03-28: qty 1

## 2018-03-28 MED ORDER — BUPIVACAINE-EPINEPHRINE (PF) 0.5% -1:200000 IJ SOLN
INTRAMUSCULAR | Status: AC
  Filled 2018-03-28: qty 30

## 2018-03-28 MED ORDER — ZINC 50 MG PO CAPS: 50.0000 mg | ORAL_CAPSULE | Freq: Every day | ORAL | Status: DC

## 2018-03-28 MED ORDER — PROMETHAZINE HCL 25 MG/ML IJ SOLN
INTRAMUSCULAR | Status: AC
  Filled 2018-03-28: qty 1

## 2018-03-28 MED ORDER — METHOCARBAMOL 500 MG PO TABS
ORAL_TABLET | ORAL | Status: AC
  Administered 2018-03-28: 500 mg via ORAL
  Filled 2018-03-28: qty 1

## 2018-03-28 MED ORDER — MIDAZOLAM HCL 5 MG/5ML IJ SOLN
INTRAMUSCULAR | Status: DC | PRN
  Administered 2018-03-28: 2 mg via INTRAVENOUS

## 2018-03-28 MED ORDER — KCL IN DEXTROSE-NACL 20-5-0.45 MEQ/L-%-% IV SOLN: INTRAVENOUS | Status: AC

## 2018-03-28 MED ORDER — THROMBIN (RECOMBINANT) 20000 UNITS EX SOLR
CUTANEOUS | Status: DC | PRN
  Administered 2018-03-28: 20 mL via TOPICAL

## 2018-03-28 MED ORDER — PHENYLEPHRINE 40 MCG/ML (10ML) SYRINGE FOR IV PUSH (FOR BLOOD PRESSURE SUPPORT)
PREFILLED_SYRINGE | INTRAVENOUS | Status: AC
  Filled 2018-03-28: qty 10

## 2018-03-28 MED ORDER — ONDANSETRON HCL 4 MG/2ML IJ SOLN
INTRAMUSCULAR | Status: AC
  Filled 2018-03-28: qty 2

## 2018-03-28 MED ORDER — DEXAMETHASONE SODIUM PHOSPHATE 10 MG/ML IJ SOLN
INTRAMUSCULAR | Status: DC | PRN
  Administered 2018-03-28: 10 mg via INTRAVENOUS

## 2018-03-28 SURGICAL SUPPLY — 54 items
BAG DECANTER FOR FLEXI CONT (MISCELLANEOUS) ×2 IMPLANT
CLEANER TIP ELECTROSURG 2X2 (MISCELLANEOUS) ×2 IMPLANT
CLOTH 2% CHLOROHEXIDINE 3PK (PERSONAL CARE ITEMS) ×2 IMPLANT
CLSR STERI-STRIP ANTIMIC 1/2X4 (GAUZE/BANDAGES/DRESSINGS) ×2 IMPLANT
CONT SPEC 4OZ CLIKSEAL STRL BL (MISCELLANEOUS) ×2 IMPLANT
DRAPE LAPAROTOMY 100X72X124 (DRAPES) ×2 IMPLANT
DRAPE MICROSCOPE LEICA (MISCELLANEOUS) ×2 IMPLANT
DRAPE SHEET LG 3/4 BI-LAMINATE (DRAPES) ×2 IMPLANT
DRAPE SURG 17X11 SM STRL (DRAPES) ×2 IMPLANT
DRAPE UTILITY XL STRL (DRAPES) ×2 IMPLANT
DRSG AQUACEL AG ADV 3.5X 4 (GAUZE/BANDAGES/DRESSINGS) ×2 IMPLANT
DRSG AQUACEL AG ADV 3.5X 6 (GAUZE/BANDAGES/DRESSINGS) IMPLANT
DRSG TELFA 3X8 NADH (GAUZE/BANDAGES/DRESSINGS) IMPLANT
DURAPREP 26ML APPLICATOR (WOUND CARE) ×2 IMPLANT
DURASEAL SPINE SEALANT 3ML (MISCELLANEOUS) IMPLANT
ELECT BLADE 4.0 EZ CLEAN MEGAD (MISCELLANEOUS) ×2
ELECTRODE BLDE 4.0 EZ CLN MEGD (MISCELLANEOUS) ×1 IMPLANT
GLOVE BIOGEL PI IND STRL 6.5 (GLOVE) ×1 IMPLANT
GLOVE BIOGEL PI IND STRL 7.0 (GLOVE) ×1 IMPLANT
GLOVE BIOGEL PI IND STRL 8 (GLOVE) ×4 IMPLANT
GLOVE BIOGEL PI INDICATOR 6.5 (GLOVE) ×1
GLOVE BIOGEL PI INDICATOR 7.0 (GLOVE) ×1
GLOVE BIOGEL PI INDICATOR 8 (GLOVE) ×4
GLOVE SURG SS PI 7.5 STRL IVOR (GLOVE) ×16 IMPLANT
GLOVE SURG SS PI 8.0 STRL IVOR (GLOVE) ×6 IMPLANT
GOWN STRL REUS W/ TWL LRG LVL3 (GOWN DISPOSABLE) ×1 IMPLANT
GOWN STRL REUS W/ TWL XL LVL3 (GOWN DISPOSABLE) ×1 IMPLANT
GOWN STRL REUS W/TWL LRG LVL3 (GOWN DISPOSABLE) ×1
GOWN STRL REUS W/TWL XL LVL3 (GOWN DISPOSABLE) ×1
IV CATH 14GX2 1/4 (CATHETERS) ×2 IMPLANT
KIT BASIN OR (CUSTOM PROCEDURE TRAY) ×2 IMPLANT
NEEDLE 22X1 1/2 (OR ONLY) (NEEDLE) ×2 IMPLANT
NEEDLE SPNL 18GX3.5 QUINCKE PK (NEEDLE) ×4 IMPLANT
PACK LAMINECTOMY NEURO (CUSTOM PROCEDURE TRAY) ×2 IMPLANT
PATTIES SURGICAL .5 X.5 (GAUZE/BANDAGES/DRESSINGS) ×2 IMPLANT
PATTIES SURGICAL .75X.75 (GAUZE/BANDAGES/DRESSINGS) IMPLANT
RUBBERBAND STERILE (MISCELLANEOUS) ×4 IMPLANT
SPONGE LAP 4X18 X RAY DECT (DISPOSABLE) IMPLANT
SPONGE SURGIFOAM ABS GEL 100 (HEMOSTASIS) ×2 IMPLANT
STAPLER VISISTAT (STAPLE) ×2 IMPLANT
STRIP CLOSURE SKIN 1/2X4 (GAUZE/BANDAGES/DRESSINGS) ×2 IMPLANT
SUT NURALON 4 0 TR CR/8 (SUTURE) IMPLANT
SUT PROLENE 3 0 PS 2 (SUTURE) IMPLANT
SUT VIC AB 0 CT2 27 (SUTURE) ×2 IMPLANT
SUT VIC AB 1 CT1 27 (SUTURE)
SUT VIC AB 1 CT1 27XBRD ANTBC (SUTURE) IMPLANT
SUT VIC AB 1-0 CT2 27 (SUTURE) IMPLANT
SUT VIC AB 2-0 CT1 27 (SUTURE)
SUT VIC AB 2-0 CT1 TAPERPNT 27 (SUTURE) IMPLANT
SUT VIC AB 2-0 CT2 27 (SUTURE) ×2 IMPLANT
SYR 3ML LL SCALE MARK (SYRINGE) ×2 IMPLANT
TOWEL GREEN STERILE (TOWEL DISPOSABLE) ×2 IMPLANT
TOWEL GREEN STERILE FF (TOWEL DISPOSABLE) ×2 IMPLANT
YANKAUER SUCT BULB TIP NO VENT (SUCTIONS) ×2 IMPLANT

## 2018-03-28 NOTE — Transfer of Care (Signed)
Immediate Anesthesia Transfer of Care Note  Patient: Dorothy Gibson  Procedure(s) Performed: Microlumbar decompression Lumbar five-Sacral one left (Left Spine Lumbar)  Patient Location: PACU  Anesthesia Type:General  Level of Consciousness: patient cooperative and responds to stimulation  Airway & Oxygen Therapy: Patient Spontanous Breathing and Patient connected to nasal cannula oxygen  Post-op Assessment: Report given to RN and Post -op Vital signs reviewed and stable  Post vital signs: Reviewed and stable  Last Vitals:  Vitals Value Taken Time  BP 114/73 03/28/2018  9:23 AM  Temp    Pulse 83 03/28/2018  9:24 AM  Resp 17 03/28/2018  9:24 AM  SpO2 94 % 03/28/2018  9:24 AM  Vitals shown include unvalidated device data.  Last Pain:  Vitals:   03/28/18 0633  TempSrc: Oral  PainSc:          Complications: No apparent anesthesia complications

## 2018-03-28 NOTE — Plan of Care (Signed)
  Problem: Activity: Goal: Ability to avoid complications of mobility impairment will improve Outcome: Progressing Goal: Ability to tolerate increased activity will improve Outcome: Progressing Goal: Will remain free from falls Outcome: Progressing   Problem: Bowel/Gastric: Goal: Gastrointestinal status for postoperative course will improve Outcome: Progressing   Problem: Education: Goal: Ability to verbalize activity precautions or restrictions will improve Outcome: Progressing Goal: Knowledge of the prescribed therapeutic regimen will improve Outcome: Progressing Goal: Understanding of discharge needs will improve Outcome: Progressing   Problem: Physical Regulation: Goal: Ability to maintain clinical measurements within normal limits will improve Outcome: Progressing Goal: Postoperative complications will be avoided or minimized Outcome: Progressing Goal: Diagnostic test results will improve Outcome: Progressing   Problem: Pain Management: Goal: Pain level will decrease Outcome: Progressing   Problem: Skin Integrity: Goal: Signs of wound healing will improve Outcome: Progressing   Problem: Health Behavior/Discharge Planning: Goal: Identification of resources available to assist in meeting health care needs will improve Outcome: Progressing   Problem: Bladder/Genitourinary: Goal: Urinary functional status for postoperative course will improve Outcome: Progressing   Problem: Education: Goal: Knowledge of General Education information will improve Outcome: Progressing   Problem: Health Behavior/Discharge Planning: Goal: Ability to manage health-related needs will improve Outcome: Progressing   Problem: Clinical Measurements: Goal: Ability to maintain clinical measurements within normal limits will improve Outcome: Progressing Goal: Will remain free from infection Outcome: Progressing Goal: Diagnostic test results will improve Outcome: Progressing Goal:  Respiratory complications will improve Outcome: Progressing Goal: Cardiovascular complication will be avoided Outcome: Progressing   Problem: Activity: Goal: Risk for activity intolerance will decrease Outcome: Progressing   Problem: Nutrition: Goal: Adequate nutrition will be maintained Outcome: Progressing   Problem: Coping: Goal: Level of anxiety will decrease Outcome: Progressing   Problem: Elimination: Goal: Will not experience complications related to bowel motility Outcome: Progressing Goal: Will not experience complications related to urinary retention Outcome: Progressing   Problem: Pain Managment: Goal: General experience of comfort will improve Outcome: Progressing   Problem: Safety: Goal: Ability to remain free from injury will improve Outcome: Progressing   Problem: Skin Integrity: Goal: Risk for impaired skin integrity will decrease Outcome: Progressing   

## 2018-03-28 NOTE — Interval H&P Note (Signed)
History and Physical Interval Note:  03/28/2018 7:17 AM  Dorothy CaoLaura Jane Gibson  has presented today for surgery, with the diagnosis of HNP L5-S1 Left  The various methods of treatment have been discussed with the patient and family. After consideration of risks, benefits and other options for treatment, the patient has consented to  Procedure(s) with comments: Microlumbar decompression L5-S1 left (Left) - 120 mins as a surgical intervention .  The patient's history has been reviewed, patient examined, no change in status, stable for surgery.  I have reviewed the patient's chart and labs.  Questions were answered to the patient's satisfaction.     BEANE,JEFFREY C

## 2018-03-28 NOTE — Interval H&P Note (Signed)
History and Physical Interval Note:  03/28/2018 7:17 AM  Dorothy Gibson  has presented today for surgery, with the diagnosis of HNP L5-S1 Left  The various methods of treatment have been discussed with the patient and family. After consideration of risks, benefits and other options for treatment, the patient has consented to  Procedure(s) with comments: Microlumbar decompression L5-S1 left (Left) - 120 mins as a surgical intervention .  The patient's history has been reviewed, patient examined, no change in status, stable for surgery.  I have reviewed the patient's chart and labs.  Questions were answered to the patient's satisfaction.     BEANE,JEFFREY C   

## 2018-03-28 NOTE — Brief Op Note (Signed)
03/28/2018  9:09 AM  PATIENT:  Dorothy Gibson  52 y.o. female  PRE-OPERATIVE DIAGNOSIS:  HNP L5-S1 Left  POST-OPERATIVE DIAGNOSIS:  HNP L5-S1 Left  PROCEDURE:  Procedure(s): Microlumbar decompression Lumbar five-Sacral one left (Left)  SURGEON:  Surgeon(s) and Role:    Jene Every* Beane, Jeffrey, MD - Primary  PHYSICIAN ASSISTANT:   ASSISTANTS: Bissell   ANESTHESIA:   general  EBL:  100 mL   BLOOD ADMINISTERED:none  DRAINS: none   LOCAL MEDICATIONS USED:  MARCAINE     SPECIMEN:  Source of Specimen:  L5S1  DISPOSITION OF SPECIMEN:  PATHOLOGY  COUNTS:  YES  TOURNIQUET:  * No tourniquets in log *  DICTATION: .Other Dictation: Dictation Number [id number]  PLAN OF CARE: Admit for overnight observation  PATIENT DISPOSITION:  PACU - hemodynamically stable.   Delay start of Pharmacological VTE agent (>24hrs) due to surgical blood loss or risk of bleeding: yes

## 2018-03-28 NOTE — Op Note (Signed)
NAME:  Dorothy Gibson, Dorothy Gibson              ACCOUNT NO.:  [id number]  MEDICAL RECORD NO.:  [id number]  LOCATION:  [facility]                         FACILITY:  [facility]  PHYSICIAN:  Jene EveryJeffrey Beane, M.D.    DATE OF BIRTH:  07-29-1966  DATE OF PROCEDURE:  03/28/2018 DATE OF DISCHARGE:                              OPERATIVE REPORT   PREOPERATIVE DIAGNOSES: 1. Spinal stenosis, L5-S1, left. 2. Herniated nucleus pulposus, L5-S1, left. 3. Elevated BMI of 42.  POSTOPERATIVE DIAGNOSES: 1. Spinal stenosis, L5-S1, left. 2. Herniated nucleus pulposus, L5-S1, left. 3. Elevated BMI of 42.  PROCEDURE PERFORMED: 1. Microlumbar decompression, L5-S1, left. 2. Foraminotomies, L5-S1, left. 3. Microdiskectomy, L5-S1, left. Technical difficulty increased due to the patient's elevated BMI.  HISTORY:  A 51, S1 radicular pain secondary to disk herniation, lateral recess stenosis, L5-S1 scoliosis.  She had refractory conservative treatment including injections, therapy, exercise program, neural tension signs, EHL weakness.  Indicated for a microlumbar decompression, L5-S1, left.  Risks and benefits discussed including bleeding, infection, damage to neurovascular structures, no change in symptoms, worsening symptoms, DVT, PE, anesthetic complications, etc.  TECHNIQUE:  With the patient in supine position after induction of adequate anesthesia, 3 g Kefzol, placed prone on the Wilson frame.  Bony prominences were well padded.  Extra time taken to position the patient. Lumbar region was prepped and draped in usual sterile fashion.  Two 18- gauge spinal needle were utilized to localize L5-S1 interspace, confirmed with x-ray.  Incision was made from spinous process of L5-S1. Subcutaneous tissue was dissected.  Electrocautery was utilized to achieve hemostasis.  Ample subcutaneous adipose layer required a McCullough retractor to gain access to the fascia.  The dorsolumbar fascia was divided in line with the skin incision.   Paraspinous muscle elevated from lamina 5 and S1.  Operating microscope draped and brought on the surgical field.  Confirmatory radiograph obtained.  Sacrum palpated.  Ligamentum flavum detached from the cephalad edge of S1 utilizing microcurette.  Patty placed beneath the ligamentum flavum. Hypertrophic facet was noted.  With the neural elements well protected, I performed a generous foraminotomy of S1.  Extensive epidural venous plexus was noted.  Associated bleeding.  This was isolated and cauterized.  Bone wax placed on the cancellous surfaces.  S1 nerve root was gently mobilized medially, had been compressed in the lateral recess.  The decompressed lateral recess to the medial border of the pedicle undercutting the superior articulating process of the facet to the foramen of L5.  Neural elements protected again at all times.  A focal HNP was noted at L5-S1, and annulotomy was performed and copious portion of disk material was removed from the disk space with straight micropituitary, further mobilized with catheter lavage irrigation of additional fragments.  Following this and lysis of an epidural venous plexus with 1 cm of excursion of the S1 nerve root medial to the pedicle without tension, neural probe passed freely up the foramen of L5 and S1. The sacrum was palpated.  Requested intraoperative radiograph; however, the radiology was unavailable and unable to contact.  After a period, it was felt as appropriate to move forward with the conclusion of the case.  Again, with the sacrum palpating the  pathology noted, seen on the MRI, I felt we had confirmed the level with our previous radiograph.  No active bleeding or CSF leakage was noted.  Epidural fat was draped over the S1 nerve root.  Dorsolumbar fascia reapproximated 1 Vicryl subcu with multiple 2-0 layers.  We had to use extra long retractors. We used staples on the skin to avoid dehiscence.  Wound was dressed sterilely,  placed supine on the hospital bed, extubated without difficulty, and transported to the recovery room in satisfactory condition.  The patient tolerated the procedure well.  No complications.  ASSISTANT:  Andrez Grime, PA.  ESTIMATED BLOOD LOSS:  Minimal.     Jene Every, M.D.     Cordelia Pen  D:  03/28/2018  T:  03/28/2018  Job:  [id number]

## 2018-03-28 NOTE — Interval H&P Note (Signed)
History and Physical Interval Note:  03/28/2018 7:18 AM  Dorothy Gibson  has presented today for surgery, with the diagnosis of HNP L5-S1 Left  The various methods of treatment have been discussed with the patient and family. After consideration of risks, benefits and other options for treatment, the patient has consented to  Procedure(s) with comments: Microlumbar decompression L5-S1 left (Left) - 120 mins as a surgical intervention .  The patient's history has been reviewed, patient examined, no change in status, stable for surgery.  I have reviewed the patient's chart and labs.  Questions were answered to the patient's satisfaction.     BEANE,JEFFREY C

## 2018-03-28 NOTE — Evaluation (Signed)
Physical Therapy Evaluation Patient Details Name: Dorothy CaoLaura Jane Gibson MRN: [id number] DOB: 11/02/1966 Today's Date: 03/28/2018   History of Present Illness  Pt is a 52 y/o female s/p L5-S1 microlumbar decompression. PMH includes anemia.  Clinical Impression  Patient is s/p above surgery resulting in the deficits listed below (see PT Problem List). Pt guarded during gait, however, tolerated well. Required gross min guard A for mobility. Educated about precautions and generalized walking program.  Patient will benefit from skilled PT to increase their independence and safety with mobility (while adhering to their precautions) to allow discharge to the venue listed below.     Follow Up Recommendations No PT follow up;Supervision for mobility/OOB    Equipment Recommendations  3in1 (PT)    Recommendations for Other Services       Precautions / Restrictions Precautions Precautions: Back Precaution Booklet Issued: Yes (comment) Precaution Comments: Reviewed back precautions with pt.  Restrictions Weight Bearing Restrictions: No      Mobility  Bed Mobility Overal bed mobility: Needs Assistance Bed Mobility: Sidelying to Sit   Sidelying to sit: Min assist       General bed mobility comments: Min A for trunk elevation. Educated about use of log roll technique at home.   Transfers Overall transfer level: Needs assistance Equipment used: None Transfers: Sit to/from Stand Sit to Stand: Min guard         General transfer comment: Min guard for steadying assist.   Ambulation/Gait Ambulation/Gait assistance: Min guard Ambulation Distance (Feet): 125 Feet Assistive device: (IV pole) Gait Pattern/deviations: Step-through pattern;Decreased stride length Gait velocity: Decreased  Gait velocity interpretation: Below normal speed for age/gender General Gait Details: Slow, guarded gait. Mild unsteadiness, however, no overt LOB noted. Educated about generalized walking program to  perform at home.   Stairs            Wheelchair Mobility    Modified Rankin (Stroke Patients Only)       Balance Overall balance assessment: Needs assistance Sitting-balance support: No upper extremity supported;Feet supported Sitting balance-Leahy Scale: Good     Standing balance support: During functional activity;Single extremity supported;No upper extremity supported Standing balance-Leahy Scale: Fair Standing balance comment: Able to maintain static standing without UE support.                              Pertinent Vitals/Pain Pain Assessment: Faces Faces Pain Scale: Hurts little more Pain Location: back  Pain Descriptors / Indicators: Aching;Operative site guarding Pain Intervention(s): Limited activity within patient's tolerance;Monitored during session;Repositioned    Home Living Family/patient expects to be discharged to:: Private residence Living Arrangements: Spouse/significant other Available Help at Discharge: Family;Available 24 hours/day Type of Home: House Home Access: Stairs to enter Entrance Stairs-Rails: None Entrance Stairs-Number of Steps: 2 Home Layout: One level Home Equipment: Shower seat - built in      Prior Function Level of Independence: Independent               Hand Dominance        Extremity/Trunk Assessment   Upper Extremity Assessment Upper Extremity Assessment: Defer to OT evaluation    Lower Extremity Assessment Lower Extremity Assessment: LLE deficits/detail;Generalized weakness LLE Deficits / Details: Reports LLE pain is still throughout leg.     Cervical / Trunk Assessment Cervical / Trunk Assessment: Other exceptions Cervical / Trunk Exceptions: s/p lumbar surgery   Communication   Communication: No difficulties  Cognition Arousal/Alertness: Awake/alert Behavior During  Therapy: WFL for tasks assessed/performed Overall Cognitive Status: Within Functional Limits for tasks assessed                                         General Comments General comments (skin integrity, edema, etc.): Pt's husband present at beginning and end of session.     Exercises     Assessment/Plan    PT Assessment Patient needs continued PT services  PT Problem List Decreased strength;Decreased balance;Decreased mobility;Decreased knowledge of precautions;Pain       PT Treatment Interventions Gait training;Stair training;Functional mobility training;Therapeutic activities;Therapeutic exercise;Balance training;Neuromuscular re-education;Patient/family education    PT Goals (Current goals can be found in the Care Plan section)  Acute Rehab PT Goals Patient Stated Goal: to get better  PT Goal Formulation: With patient Time For Goal Achievement: 04/11/18 Potential to Achieve Goals: Good    Frequency Min 5X/week   Barriers to discharge        Co-evaluation               AM-PAC PT "6 Clicks" Daily Activity  Outcome Measure Difficulty turning over in bed (including adjusting bedclothes, sheets and blankets)?: A Little Difficulty moving from lying on back to sitting on the side of the bed? : Unable Difficulty sitting down on and standing up from a chair with arms (e.g., wheelchair, bedside commode, etc,.)?: Unable Help needed moving to and from a bed to chair (including a wheelchair)?: A Little Help needed walking in hospital room?: A Little Help needed climbing 3-5 steps with a railing? : A Little 6 Click Score: 14    End of Session Equipment Utilized During Treatment: Gait belt Activity Tolerance: Patient tolerated treatment well Patient left: in chair;with call bell/phone within reach Nurse Communication: Mobility status PT Visit Diagnosis: Other abnormalities of gait and mobility (R26.89);Pain Pain - part of body: (back )    Time: 1610-9604 PT Time Calculation (min) (ACUTE ONLY): 25 min   Charges:   PT Evaluation $PT Eval Low Complexity: 1 Low PT  Treatments $Gait Training: 8-22 mins   PT G Codes:        Gladys Damme, PT, DPT  Acute Rehabilitation Services  Pager: [phone]   Lehman Prom 03/28/2018, 2:52 PM

## 2018-03-28 NOTE — Discharge Instructions (Signed)

## 2018-03-28 NOTE — Anesthesia Postprocedure Evaluation (Signed)
Anesthesia Post Note  Patient: Dorothy Gibson  Procedure(s) Performed: Microlumbar decompression Lumbar five-Sacral one left (Left Spine Lumbar)     Patient location during evaluation: PACU Anesthesia Type: General Level of consciousness: awake and alert Pain management: pain level controlled Vital Signs Assessment: post-procedure vital signs reviewed and stable Respiratory status: spontaneous breathing, nonlabored ventilation and respiratory function stable Cardiovascular status: blood pressure returned to baseline and stable Postop Assessment: no apparent nausea or vomiting Anesthetic complications: no    Last Vitals:  Vitals:   03/28/18 0955 03/28/18 1000  BP: 113/67   Pulse: 60 (!) 55  Resp: 19 14  Temp:    SpO2: 97% 92%    Last Pain:  Vitals:   03/28/18 1000  TempSrc:   PainSc: 3                  Beryle Lathehomas E Brock

## 2018-03-29 ENCOUNTER — Encounter (HOSPITAL_COMMUNITY): Payer: Self-pay | Admitting: Specialist

## 2018-03-29 DIAGNOSIS — M48061 Spinal stenosis, lumbar region without neurogenic claudication: Secondary | ICD-10-CM | POA: Diagnosis not present

## 2018-03-29 DIAGNOSIS — Z79899 Other long term (current) drug therapy: Secondary | ICD-10-CM | POA: Diagnosis not present

## 2018-03-29 DIAGNOSIS — Z791 Long term (current) use of non-steroidal anti-inflammatories (NSAID): Secondary | ICD-10-CM | POA: Diagnosis not present

## 2018-03-29 DIAGNOSIS — M5127 Other intervertebral disc displacement, lumbosacral region: Secondary | ICD-10-CM | POA: Diagnosis not present

## 2018-03-29 DIAGNOSIS — M5126 Other intervertebral disc displacement, lumbar region: Secondary | ICD-10-CM | POA: Diagnosis not present

## 2018-03-29 DIAGNOSIS — K219 Gastro-esophageal reflux disease without esophagitis: Secondary | ICD-10-CM | POA: Diagnosis not present

## 2018-03-29 DIAGNOSIS — Z6841 Body Mass Index (BMI) 40.0 and over, adult: Secondary | ICD-10-CM | POA: Diagnosis not present

## 2018-03-29 LAB — BASIC METABOLIC PANEL
Anion gap: 11 (ref 5–15)
BUN: 11 mg/dL (ref 6–20)
CO2: 24 mmol/L (ref 22–32)
Calcium: 9.2 mg/dL (ref 8.9–10.3)
Chloride: 104 mmol/L (ref 101–111)
Creatinine, Ser: 0.89 mg/dL (ref 0.44–1.00)
GFR calc Af Amer: >60 mL/min (ref >60)
GFR calc non Af Amer: >60 mL/min (ref >60)
Glucose, Bld: 120 mg/dL — ABNORMAL HIGH (ref 65–99)
Potassium: 3.8 mmol/L (ref 3.5–5.1)
Sodium: 139 mmol/L (ref 135–145)

## 2018-03-29 MED ORDER — OXYCODONE HCL 5 MG PO TABS: 5.0000 mg | ORAL_TABLET | ORAL | 0 refills | Status: DC | PRN

## 2018-03-29 MED ORDER — METHOCARBAMOL 500 MG PO TABS: 500.0000 mg | ORAL_TABLET | Freq: Four times a day (QID) | ORAL | 1 refills | Status: DC | PRN

## 2018-03-29 MED ORDER — DOCUSATE SODIUM 100 MG PO CAPS: 100.0000 mg | ORAL_CAPSULE | Freq: Two times a day (BID) | ORAL | 0 refills | Status: AC

## 2018-03-29 MED ORDER — POLYETHYLENE GLYCOL 3350 17 G PO PACK: 17.0000 g | PACK | Freq: Every day | ORAL | 0 refills | Status: DC | PRN

## 2018-03-29 MED ORDER — HYDROCORTISONE 1 % EX CREA
TOPICAL_CREAM | Freq: Three times a day (TID) | CUTANEOUS | Status: DC
  Administered 2018-03-29: 11:00:00 via TOPICAL
  Filled 2018-03-29: qty 28

## 2018-03-29 MED FILL — Thrombin For Soln 20000 Unit: CUTANEOUS | Qty: 1 | Status: AC

## 2018-03-29 NOTE — Progress Notes (Signed)
Physical Therapy Treatment Patient Details Name: Dorothy Gibson MRN: [id number] DOB: March 28, 1966 Today's Date: 03/29/2018    History of Present Illness Pt is a 52 y/o female s/p L5-S1 microlumbar decompression. PMH includes anemia.    PT Comments    Pt progressing towards physical therapy goals. Was able to demonstrate transfers and ambulation with no physical assist. Pt reports sharp nerve pain running down her LLE during mobility, worsening with sitting. Pt unable to sit straight in chair, and had L hip scooted forward, with trunk lean to the R side to off weight L hip. Pt was educated on recommended positioning and attempted to get pt more comfortable with pillows, however pt restless and painful with attempting to sit in chair. Declined getting back in the bed, so pt was agreeable to attempt sitting edge of chair with call bell in hand at end of session. Pt asking questions that therapist heard pt asking nurse, and then occupational therapist, earlier in the day. Requires review of information. Will continue to follow and progress as able per POC.   Follow Up Recommendations  No PT follow up;Supervision for mobility/OOB     Equipment Recommendations  3in1 (PT)    Recommendations for Other Services       Precautions / Restrictions Precautions Precautions: Back Precaution Booklet Issued: Yes (comment) Precaution Comments: reviewed back precautions for adls Restrictions Weight Bearing Restrictions: No    Mobility  Bed Mobility               General bed mobility comments: Pt declined getting in the bed during session due to pain. Verbally reviewed log roll technique.   Transfers Overall transfer level: Needs assistance Equipment used: None Transfers: Sit to/from Stand Sit to Stand: Supervision         General transfer comment: VC's for hand placement on seated surface for safety. Pt was able to power-up to full standing without assistance however appeared very  painful.   Ambulation/Gait Ambulation/Gait assistance: Min guard Ambulation Distance (Feet): 500 Feet   Gait Pattern/deviations: Step-through pattern;Decreased stride length Gait velocity: Decreased  Gait velocity interpretation: 1.31 - 2.62 ft/sec, indicative of limited community ambulator General Gait Details: Slow, guarded gait. Mild unsteadiness, however, no overt LOB noted. Educated about generalized walking program to perform at home.    Stairs Stairs: Yes Stairs assistance: Min guard Stair Management: One rail Left;Step to pattern;Forwards;No rails Number of Stairs: 6(x2, x2, x2) General stair comments: Pt was instructed in sequencing and general safety. Initially practiced 2x2 stairs with hand rail, and the last x2 stairs HHA only without rails to simulate home environment.    Wheelchair Mobility    Modified Rankin (Stroke Patients Only)       Balance Overall balance assessment: Needs assistance Sitting-balance support: No upper extremity supported;Feet supported Sitting balance-Leahy Scale: Good     Standing balance support: During functional activity;Single extremity supported;No upper extremity supported Standing balance-Leahy Scale: Good Standing balance comment: Able to maintain static standing without UE support.                             Cognition Arousal/Alertness: Awake/alert Behavior During Therapy: WFL for tasks assessed/performed Overall Cognitive Status: Within Functional Limits for tasks assessed                                        Exercises  General Comments        Pertinent Vitals/Pain Pain Assessment: Faces Faces Pain Scale: Hurts even more Pain Location: back Pain Descriptors / Indicators: Operative site guarding;Sore Pain Intervention(s): Monitored during session    Home Living Family/patient expects to be discharged to:: Private residence Living Arrangements: Spouse/significant  other Available Help at Discharge: Family;Available 24 hours/day Type of Home: House Home Access: Stairs to enter Entrance Stairs-Rails: None Home Layout: One level Home Equipment: Shower seat - built in Additional Comments: pt has a cat that is elderly and incontinent that she is very concerned about . 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     Prior Function Level of Independence: Independent          PT Goals (current goals can now be found in the care plan section) Acute Rehab PT Goals Patient Stated Goal: to get better  PT Goal Formulation: With patient Time For Goal Achievement: 04/11/18 Potential to Achieve Goals: Good Progress towards PT goals: Progressing toward goals    Frequency    Min 5X/week      PT Plan Current plan remains appropriate    Co-evaluation              AM-PAC PT "6 Clicks" Daily Activity  Outcome Measure  Difficulty turning over in bed (including adjusting bedclothes, sheets and blankets)?: A Little Difficulty moving from lying on back to sitting on the side of the bed? : Unable Difficulty sitting down on and standing up from a chair with arms (e.g., wheelchair, bedside commode, etc,.)?: Unable Help needed moving to and from a bed to chair (including a wheelchair)?: A Little Help needed walking in hospital room?: A Little Help needed climbing 3-5 steps with a railing? : A Little 6 Click Score: 14    End of Session Equipment Utilized During Treatment: Gait belt Activity Tolerance: Patient tolerated treatment well Patient left: in chair;with call bell/phone within reach Nurse Communication: Mobility status PT Visit Diagnosis: Other abnormalities of gait and mobility (R26.89);Pain Pain - part of body: (back )     Time: 1010-1039 PT Time Calculation (min) (ACUTE ONLY): 29 min  Charges:  $Gait Training: 23-37 mins                    G Codes:       Conni SlipperLaura Kirkman, PT, DPT Acute Rehabilitation Services Pager: [phone]     Marylynn PearsonLaura D Kirkman 03/29/2018, 1:07 PM

## 2018-03-29 NOTE — Discharge Summary (Signed)
Physician Discharge Summary   Patient ID: Dorothy Gibson MRN: [id number] DOB/AGE: Sep 21, 1966 52 y.o.  Admit date: 03/28/2018 Discharge date: 03/29/2018  Primary Diagnosis:   HNP L5-S1 Left  Admission Diagnoses:  Past Medical History:  Diagnosis Date  . Anemia   . Arthritis   . Complication of anesthesia    broke out with laughing gas   . Dysrhythmia    pvc's adn Avc's  . GERD (gastroesophageal reflux disease)   . Obesity   . PVC's (premature ventricular contractions)    Discharge Diagnoses:   Principal Problem:   HNP (herniated nucleus pulposus), lumbar Active Problems:   Spinal stenosis, lumbar  Procedure:  Procedure(s) (LRB): Microlumbar decompression Lumbar five-Sacral one left (Left)   Consults: None  HPI:  see H&P    Laboratory Data: Hospital Outpatient Visit on 03/21/2018  Component Date Value Ref Range Status  . MRSA, PCR 03/21/2018 NEGATIVE  NEGATIVE Final  . Staphylococcus aureus 03/21/2018 NEGATIVE  NEGATIVE Final   Comment: (NOTE) The Xpert SA Assay (FDA approved for NASAL specimens in patients 30 years of age and older), is one component of a comprehensive surveillance program. It is not intended to diagnose infection nor to guide or monitor treatment. Performed at [facility], [place] [address] [place], [place] [address]   . Sodium 03/21/2018 139  135 - 145 mmol/L Final  . Potassium 03/21/2018 3.8  3.5 - 5.1 mmol/L Final  . Chloride 03/21/2018 105  101 - 111 mmol/L Final  . CO2 03/21/2018 24  22 - 32 mmol/L Final  . Glucose, Bld 03/21/2018 108* 65 - 99 mg/dL Final  . BUN 03/21/2018 14  6 - 20 mg/dL Final  . Creatinine, Ser 03/21/2018 0.81  0.44 - 1.00 mg/dL Final  . Calcium 03/21/2018 9.5  8.9 - 10.3 mg/dL Final  . GFR calc non Af Amer 03/21/2018 >60  >60 mL/min Final  . GFR calc Af Amer 03/21/2018 >60  >60 mL/min Final   Comment: (NOTE) The eGFR has been calculated using the CKD EPI equation. This calculation has not been validated  in all clinical situations. eGFR's persistently <60 mL/min signify possible Chronic Kidney Disease.   . Anion gap 03/21/2018 10  5 - 15 Final   Performed at [facility], [place] [address] [place], [place] [address]  . WBC 03/21/2018 7.0  4.0 - 10.5 K/uL Final  . RBC 03/21/2018 3.94  3.87 - 5.11 MIL/uL Final  . Hemoglobin 03/21/2018 12.3  12.0 - 15.0 g/dL Final  . HCT 03/21/2018 38.3  36.0 - 46.0 % Final  . MCV 03/21/2018 97.2  78.0 - 100.0 fL Final  . MCH 03/21/2018 31.2  26.0 - 34.0 pg Final  . MCHC 03/21/2018 32.1  30.0 - 36.0 g/dL Final  . RDW 03/21/2018 13.3  11.5 - 15.5 % Final  . Platelets 03/21/2018 281  150 - 400 K/uL Final   Performed at [facility], [place] [address] [place], [place] [address]   No results for input(s): HGB in the last 72 hours. No results for input(s): WBC, RBC, HCT, PLT in the last 72 hours. Recent Labs    03/29/18 0626  NA 139  K 3.8  CL 104  CO2 24  BUN 11  CREATININE 0.89  GLUCOSE 120*  CALCIUM 9.2   No results for input(s): LABPT, INR in the last 72 hours.  X-Rays:Dg Lumbar Spine 2-3 Views  Result Date: 03/28/2018 CLINICAL DATA:  Intraoperative localization films for L5-S1  Decompression EXAM: LUMBAR SPINE - 2-3 VIEW COMPARISON:  03/21/2018 FINDINGS: Two lateral lumbar intraoperative radiographs provided. First image demonstrates 2 sharp tip probes posterior to the L5 and S1 spinous processes. Second film demonstrates a curved tip probe at the L5-S1 interval. IMPRESSION: Intraoperative views as above. Electronically Signed   By: Suzy Bouchard M.D.   On: 03/28/2018 11:48   Dg Lumbar Spine 2-3 Views  Result Date: 03/22/2018 CLINICAL DATA:  Lumbar HNP.  Preop back surgery. EXAM: LUMBAR SPINE - 2-3 VIEW COMPARISON:  None. FINDINGS: Slight levoscoliosis in the lumbar spine. Normal alignment. Mild degenerative facet disease in the mid and lower lumbar spine. No fracture. SI joints are symmetric and unremarkable. IMPRESSION: Mild degenerative  facet disease in the lower lumbar spine. No acute bony abnormality. Electronically Signed   By: Rolm Baptise M.D.   On: 03/22/2018 08:28    EKG: Orders placed or performed in visit on 03/21/18  . EKG 12-Lead  . EKG 12-Lead  . EKG 12-Lead     Hospital Course: Patient was admitted to [facility] and taken to the OR and underwent the above state procedure without complications.  Patient tolerated the procedure well and was later transferred to the recovery room and then to the orthopaedic floor for postoperative care.  They were given PO and IV analgesics for pain control following their surgery.  They were given 24 hours of postoperative antibiotics.   PT was consulted postop to assist with mobility and transfers.  The patient was allowed to be WBAT with therapy and was taught back precautions. Discharge planning was consulted to help with postop disposition and equipment needs.  Patient had a fair night on the evening of surgery and started to get up OOB with therapy on day one. Patient was seen in rounds and was ready to go home on day one.  They were given discharge instructions and dressing directions.  They were instructed on when to follow up in the office with Dr. Tonita Cong.   Diet: Regular diet Activity:WBAT, Lspine precautions Follow-up:in 10-14 days Disposition - Home Discharged Condition: good    Allergies as of 03/29/2018      Reactions   Ivp Dye [iodinated Diagnostic Agents] Anaphylaxis, Palpitations   Other Dermatitis   Paper tape or bandaids causes skin redness/blotchiness Patient states she tolerates adhesive tape   Diltiazem Rash, Other (See Comments)   Rash and constipation   Feldene [piroxicam] Rash   Latex Rash   Neosporin [neomycin-bacitracin Zn-polymyx] Rash, Other (See Comments)   Red/puffiness/skin irritation.      Medication List    STOP taking these medications   CULTURELLE Caps   Fish Oil 1200 MG Caps     TAKE these medications   B-12 2500 MCG  Subl Place 2,500 mcg under the tongue daily.   BOUDREAUXS BUTT PASTE EX Apply 1 application topically 2 (two) times daily as needed (for skin irritation/hemmorroids.).   cycloSPORINE 0.05 % ophthalmic emulsion Commonly known as:  RESTASIS Place 1 drop into both eyes at bedtime.   docusate sodium 100 MG capsule Commonly known as:  COLACE Take 1 capsule (100 mg total) by mouth 2 (two) times daily.   gabapentin 300 MG capsule Commonly known as:  NEURONTIN Take 300 mg by mouth 2 (two) times daily.   methocarbamol 500 MG tablet Commonly known as:  ROBAXIN Take 1 tablet (500 mg total) by mouth every 6 (six) hours as needed for muscle spasms.   multivitamin with minerals Tabs tablet Take  1 tablet by mouth daily. [employer] WOMEN'S MULTIVITAMIN PLUS   nystatin cream Commonly known as:  MYCOSTATIN Apply 1 application topically 2 (two) times daily as needed for dry skin.   nystatin-triamcinolone cream Commonly known as:  MYCOLOG II Apply 1 application topically 2 (two) times daily as needed (for skin irriation.).   omeprazole 40 MG capsule Commonly known as:  PRILOSEC Take 40 mg by mouth daily as needed (for heartburn).   oxyCODONE 5 MG immediate release tablet Commonly known as:  Oxy IR/ROXICODONE Take 1-2 tablets (5-10 mg total) by mouth every 4 (four) hours as needed for moderate pain or severe pain.   polyethylene glycol packet Commonly known as:  MIRALAX / GLYCOLAX Take 17 g by mouth daily as needed for mild constipation.   propranolol ER 160 MG SR capsule Commonly known as:  INDERAL LA Take 160 mg by mouth at bedtime.   propranolol 20 MG tablet Commonly known as:  INDERAL Take 20 mg by mouth daily as needed (for premature ventricular contraction.).   Zinc 50 MG Caps Take 50 mg by mouth daily.            Durable Medical Equipment  (From admission, onward)        Start     Ordered   03/28/18 1807  For home use only DME 3 n 1  Once     03/28/18 1807      Follow-up Information    Susa Day, MD Follow up in 2 week(s).   Specialty:  Orthopedic Surgery Contact information: [address] [place] [address] [phone]        Susa Day, MD In 2 weeks.   Specialty:  Orthopedic Surgery Contact information: [address] [place] [address] [phone]           Signed: Lacie Draft, PA-C Orthopaedic Surgery 03/29/2018, 9:04 AM

## 2018-03-29 NOTE — Progress Notes (Signed)
Patient is discharged from room 3C03 at this time. Alert and in stable condition. IV site d/c'd and instructions read to patient and spouse with all questions answered and understanding verbalized. Left unit via wheelchair with all belongings at side.

## 2018-03-29 NOTE — Evaluation (Signed)
Occupational Therapy Evaluation Patient Details Name: Dorothy Gibson MRN: [id number] DOB: [birth year] Today's Date: 03/29/2018    History of Present Illness Pt is a 52 y/o female s/p L5-S1 microlumbar decompression. PMH includes anemia.   Clinical Impression   Patient evaluated by Occupational Therapy with no further acute OT needs identified. All education has been completed and the patient has no further questions. See below for any follow-up Occupational Therapy or equipment needs. OT to sign off. Thank you for referral.      Follow Up Recommendations  No OT follow up    Equipment Recommendations  3 in 1 bedside commode(already delivered/ toilet aid/ reacher)    Recommendations for Other Services       Precautions / Restrictions Precautions Precautions: Back Precaution Comments: reviewed back precautions for adls      Mobility Bed Mobility               General bed mobility comments: demonstrates positioning and advised on good back alignment  Transfers Overall transfer level: Needs assistance   Transfers: Sit to/from Stand Sit to Stand: Supervision         General transfer comment: pt using hands to push from chair surface. pt needs to static stand for a second before proceeding    Balance Overall balance assessment: Needs assistance Sitting-balance support: No upper extremity supported;Feet supported Sitting balance-Leahy Scale: Good     Standing balance support: During functional activity;Single extremity supported;No upper extremity supported Standing balance-Leahy Scale: Good                             ADL either performed or assessed with clinical judgement   ADL Overall ADL's : Needs assistance/impaired Eating/Feeding: Independent   Grooming: Independent   Upper Body Bathing: Supervision/ safety   Lower Body Bathing: Supervison/ safety;With adaptive equipment;Sit to/from stand Lower Body Bathing Details (indicate cue  type and reason): pt educated on the use of long handle sponge vs reacher. pt will have spouse (A) if needed Upper Body Dressing : Independent   Lower Body Dressing: Supervision/safety;With adaptive equipment Lower Body Dressing Details (indicate cue type and reason): pt demonstrates LB dressing with reacher and sweat pants during session.  Toilet Transfer: Radiographer, therapeutic Details (indicate cue type and reason): requires use of hands ( 3n1 already delivered and family took home)   Toileting - Architect Details (indicate cue type and reason): educated on toilet hygiene with toilet tongs. OT helped patient place toilet tongs in the cart on [employer] on phone. Pt plans to have son help her purchase.      Functional mobility during ADLs: Supervision/safety General ADL Comments: pt very anxious and repeating over and over again discomfort with bed level . pt educated and demonstration provided. pt anxious about sitting in a chair and reports discomfort in a full upright position. pt sitting with a posterior pelvic tilt and states "this feels better" pt with multiple questions about the cat. pt advised to get a telescope handled poop / scoot system so that she can remain upright and not bend for task. pt instructed for the next two weeks to have family complete house hold chores. pt asking the same questions even after education. Pt needs direct chaining of information. Pt educated and then asking related questions due to inability to coorelate the same instructions are true for all GROOMING all DRESSING  Back handout provided and reviewed adls in detail. Pt educated  on: set an alarm at night for medication, avoid sitting for long periods of time, correct bed positioning for sleeping, correct sequence for bed mobility, avoiding lifting more than 5 pounds and never wash directly over incision. All education is complete and patient indicates understanding.     Vision  Baseline Vision/History: Wears glasses Wears Glasses: At all times       Perception     Praxis      Pertinent Vitals/Pain Pain Assessment: Faces Faces Pain Scale: Hurts even more Pain Location: back Pain Descriptors / Indicators: Operative site guarding;Sore Pain Intervention(s): Monitored during session;Premedicated before session;Repositioned     Hand Dominance Right   Extremity/Trunk Assessment Upper Extremity Assessment Upper Extremity Assessment: Overall WFL for tasks assessed   Lower Extremity Assessment Lower Extremity Assessment: Defer to PT evaluation   Cervical / Trunk Assessment Cervical / Trunk Assessment: Other exceptions Cervical / Trunk Exceptions: s/p lumbar surgery    Communication Communication Communication: No difficulties   Cognition Arousal/Alertness: Awake/alert Behavior During Therapy: WFL for tasks assessed/performed Overall Cognitive Status: Within Functional Limits for tasks assessed                                     General Comments       Exercises     Shoulder Instructions      Home Living Family/patient expects to be discharged to:: Private residence Living Arrangements: Spouse/significant other Available Help at Discharge: Family;Available 24 hours/day Type of Home: House Home Access: Stairs to enter [employer] of Steps: 2 Entrance Stairs-Rails: None Home Layout: One level     Bathroom Shower/Tub: Producer, television/film/video: Standard     Home Equipment: Shower seat - built in   Additional Comments: pt has a cat that is elderly and incontinent that she is very concerned about . 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       Prior Functioning/Environment Level of Independence: Independent                 OT Problem List:        OT Treatment/Interventions:      OT Goals(Current goals can be found in the care plan section) Acute Rehab OT Goals Patient Stated Goal: to get  better  OT Goal Formulation: With patient Time For Goal Achievement: 04/12/18 Potential to Achieve Goals: Good  OT Frequency:     Barriers to D/C:            Co-evaluation              AM-PAC PT "6 Clicks" Daily Activity     Outcome Measure Help from another person eating meals?: None Help from another person taking care of personal grooming?: A Little Help from another person toileting, which includes using toliet, bedpan, or urinal?: A Little Help from another person bathing (including washing, rinsing, drying)?: A Little Help from another person to put on and taking off regular upper body clothing?: A Little Help from another person to put on and taking off regular lower body clothing?: A Little 6 Click Score: 19   End of Session Nurse Communication: Mobility status;Precautions  Activity Tolerance: Patient tolerated treatment well Patient left: in chair;with call bell/phone within reach  OT Visit Diagnosis: Unsteadiness on feet (R26.81)                Time: 9147-8295 OT Time Calculation (min): 37  min Charges:  OT General Charges $OT Visit: 1 Visit OT Evaluation $OT Eval Moderate Complexity: 1 Mod OT Treatments $Therapeutic Activity: 8-22 mins G-Codes:     Mateo FlowJones, Brynn   OTR/L Pager: [phone] Office: [phone] .   Boone MasterJones, Jessica B 03/29/2018, 12:00 PM

## 2018-03-29 NOTE — Progress Notes (Signed)
Subjective: 1 Day Post-Op Procedure(s) (LRB): Microlumbar decompression Lumbar five-Sacral one left (Left) Patient reports pain as moderate.  Reports incisional pain this morning. Some buttock pain. Intermittent leg pain. Voiding without difficulty. No BM or flatus yet. No c/o abdominal pain or distention.  C/o rash inner thighs asking for hydrocortisone cream. No symptoms of yeast infx although she reports she does get them frequently.  Objective: Vital signs in last 24 hours: Temp:  [97.6 F (36.4 C)-98.7 F (37.1 C)] 98.7 F (37.1 C) (04/12 0737) Pulse Rate:  [50-84] 74 (04/12 0737) Resp:  [14-19] 16 (04/12 0737) BP: (94-121)/(45-75) 108/59 (04/12 0737) SpO2:  [92 %-100 %] 97 % (04/12 0737) Weight:  [121.1 kg (267 lb)] 121.1 kg (267 lb) (04/11 1125)  Intake/Output from previous day: 04/11 0701 - 04/12 0700 In: 1350 [I.V.:1250; IV Piggyback:100] Out: 101 [Urine:1; Blood:100] Intake/Output this shift: No intake/output data recorded.  No results for input(s): HGB in the last 72 hours. No results for input(s): WBC, RBC, HCT, PLT in the last 72 hours. Recent Labs    03/29/18 0626  NA 139  K 3.8  CL 104  CO2 24  BUN 11  CREATININE 0.89  GLUCOSE 120*  CALCIUM 9.2   No results for input(s): LABPT, INR in the last 72 hours.  Neurologically intact ABD soft Neurovascular intact Sensation intact distally Intact pulses distally Dorsiflexion/Plantar flexion intact Incision: dressing C/D/I and no drainage No cellulitis present Compartment soft no calf pain or sign of DVT   Assessment/Plan: 1 Day Post-Op Procedure(s) (LRB): Microlumbar decompression Lumbar five-Sacral one left (Left) Advance diet Up with therapy D/C IV fluids Mag citrate if needed later today for constipation Discussed Lspine precautions, dressing instructions, post-op protocols Possible D/C later today if pain remains well controlled otherwise tomorrow AM Discussed that frequent yeast infx can be a  sign of DM and she needs to discuss that with her PCP for follow up/workup Hydrocortisone cream for rash on inner thighs likely chafing no sign of yeast infx Will discuss with Dr. Elissa LovettBeane  BISSELL, Dayna BarkerJACLYN M. 03/29/2018, 8:12 AM

## 2018-05-17 DIAGNOSIS — M545 Low back pain: Secondary | ICD-10-CM | POA: Diagnosis not present

## 2018-06-04 DIAGNOSIS — M545 Low back pain: Secondary | ICD-10-CM | POA: Diagnosis not present

## 2018-06-06 DIAGNOSIS — R600 Localized edema: Secondary | ICD-10-CM | POA: Diagnosis not present

## 2018-06-11 DIAGNOSIS — M67962 Unspecified disorder of synovium and tendon, left lower leg: Secondary | ICD-10-CM | POA: Diagnosis not present

## 2018-06-11 DIAGNOSIS — M722 Plantar fascial fibromatosis: Secondary | ICD-10-CM | POA: Diagnosis not present

## 2018-07-10 DIAGNOSIS — M722 Plantar fascial fibromatosis: Secondary | ICD-10-CM | POA: Diagnosis not present

## 2018-07-12 DIAGNOSIS — R309 Painful micturition, unspecified: Secondary | ICD-10-CM | POA: Diagnosis not present

## 2018-07-12 DIAGNOSIS — N390 Urinary tract infection, site not specified: Secondary | ICD-10-CM | POA: Diagnosis not present

## 2018-07-17 DIAGNOSIS — M722 Plantar fascial fibromatosis: Secondary | ICD-10-CM | POA: Diagnosis not present

## 2018-07-24 DIAGNOSIS — M722 Plantar fascial fibromatosis: Secondary | ICD-10-CM | POA: Diagnosis not present

## 2018-07-24 DIAGNOSIS — R21 Rash and other nonspecific skin eruption: Secondary | ICD-10-CM | POA: Diagnosis not present

## 2018-07-24 DIAGNOSIS — N760 Acute vaginitis: Secondary | ICD-10-CM | POA: Diagnosis not present

## 2018-07-31 DIAGNOSIS — M722 Plantar fascial fibromatosis: Secondary | ICD-10-CM | POA: Diagnosis not present

## 2018-08-07 DIAGNOSIS — M722 Plantar fascial fibromatosis: Secondary | ICD-10-CM | POA: Diagnosis not present

## 2018-08-09 DIAGNOSIS — M25572 Pain in left ankle and joints of left foot: Secondary | ICD-10-CM | POA: Diagnosis not present

## 2018-08-09 DIAGNOSIS — M722 Plantar fascial fibromatosis: Secondary | ICD-10-CM | POA: Diagnosis not present

## 2018-08-09 DIAGNOSIS — M76822 Posterior tibial tendinitis, left leg: Secondary | ICD-10-CM | POA: Diagnosis not present

## 2018-08-21 DIAGNOSIS — M722 Plantar fascial fibromatosis: Secondary | ICD-10-CM | POA: Diagnosis not present

## 2018-09-04 DIAGNOSIS — M722 Plantar fascial fibromatosis: Secondary | ICD-10-CM | POA: Diagnosis not present

## 2018-09-06 DIAGNOSIS — M722 Plantar fascial fibromatosis: Secondary | ICD-10-CM | POA: Diagnosis not present

## 2018-09-12 DIAGNOSIS — L906 Striae atrophicae: Secondary | ICD-10-CM | POA: Diagnosis not present

## 2018-09-12 DIAGNOSIS — Z6841 Body Mass Index (BMI) 40.0 and over, adult: Secondary | ICD-10-CM | POA: Diagnosis not present

## 2018-09-12 DIAGNOSIS — R5383 Other fatigue: Secondary | ICD-10-CM | POA: Diagnosis not present

## 2018-09-20 DIAGNOSIS — M67962 Unspecified disorder of synovium and tendon, left lower leg: Secondary | ICD-10-CM | POA: Diagnosis not present

## 2018-09-20 DIAGNOSIS — M722 Plantar fascial fibromatosis: Secondary | ICD-10-CM | POA: Diagnosis not present

## 2018-10-03 DIAGNOSIS — M722 Plantar fascial fibromatosis: Secondary | ICD-10-CM | POA: Diagnosis not present

## 2018-10-03 DIAGNOSIS — M67962 Unspecified disorder of synovium and tendon, left lower leg: Secondary | ICD-10-CM | POA: Diagnosis not present

## 2018-10-24 DIAGNOSIS — M25571 Pain in right ankle and joints of right foot: Secondary | ICD-10-CM | POA: Diagnosis not present

## 2018-11-18 DIAGNOSIS — M79671 Pain in right foot: Secondary | ICD-10-CM | POA: Diagnosis not present

## 2018-11-18 DIAGNOSIS — M722 Plantar fascial fibromatosis: Secondary | ICD-10-CM | POA: Diagnosis not present

## 2018-12-30 DIAGNOSIS — M722 Plantar fascial fibromatosis: Secondary | ICD-10-CM | POA: Diagnosis not present

## 2019-01-27 DIAGNOSIS — Z01419 Encounter for gynecological examination (general) (routine) without abnormal findings: Secondary | ICD-10-CM | POA: Diagnosis not present

## 2019-01-27 DIAGNOSIS — Z6841 Body Mass Index (BMI) 40.0 and over, adult: Secondary | ICD-10-CM | POA: Diagnosis not present

## 2019-01-29 DIAGNOSIS — Z1231 Encounter for screening mammogram for malignant neoplasm of breast: Secondary | ICD-10-CM | POA: Diagnosis not present

## 2019-06-11 DIAGNOSIS — S29011A Strain of muscle and tendon of front wall of thorax, initial encounter: Secondary | ICD-10-CM | POA: Diagnosis not present

## 2019-06-13 DIAGNOSIS — M549 Dorsalgia, unspecified: Secondary | ICD-10-CM | POA: Diagnosis not present

## 2019-06-13 DIAGNOSIS — Z79899 Other long term (current) drug therapy: Secondary | ICD-10-CM | POA: Diagnosis not present

## 2019-06-13 DIAGNOSIS — X58XXXA Exposure to other specified factors, initial encounter: Secondary | ICD-10-CM | POA: Diagnosis not present

## 2019-06-13 DIAGNOSIS — Z888 Allergy status to other drugs, medicaments and biological substances status: Secondary | ICD-10-CM | POA: Diagnosis not present

## 2019-06-13 DIAGNOSIS — Z87891 Personal history of nicotine dependence: Secondary | ICD-10-CM | POA: Diagnosis not present

## 2019-06-13 DIAGNOSIS — Z885 Allergy status to narcotic agent status: Secondary | ICD-10-CM | POA: Diagnosis not present

## 2019-06-13 DIAGNOSIS — R079 Chest pain, unspecified: Secondary | ICD-10-CM | POA: Diagnosis not present

## 2019-06-13 DIAGNOSIS — Z91040 Latex allergy status: Secondary | ICD-10-CM | POA: Diagnosis not present

## 2019-06-13 DIAGNOSIS — S29011A Strain of muscle and tendon of front wall of thorax, initial encounter: Secondary | ICD-10-CM | POA: Diagnosis not present

## 2019-06-14 IMAGING — CR DG LUMBAR SPINE 2-3V
3 series · 3 of 3 positions shown · non-contrast
Comparison: None.

CLINICAL DATA: Lumbar HNP.  Preop back surgery.

EXAM:
LUMBAR SPINE - 2-3 VIEW

[w lumbar spine ap]
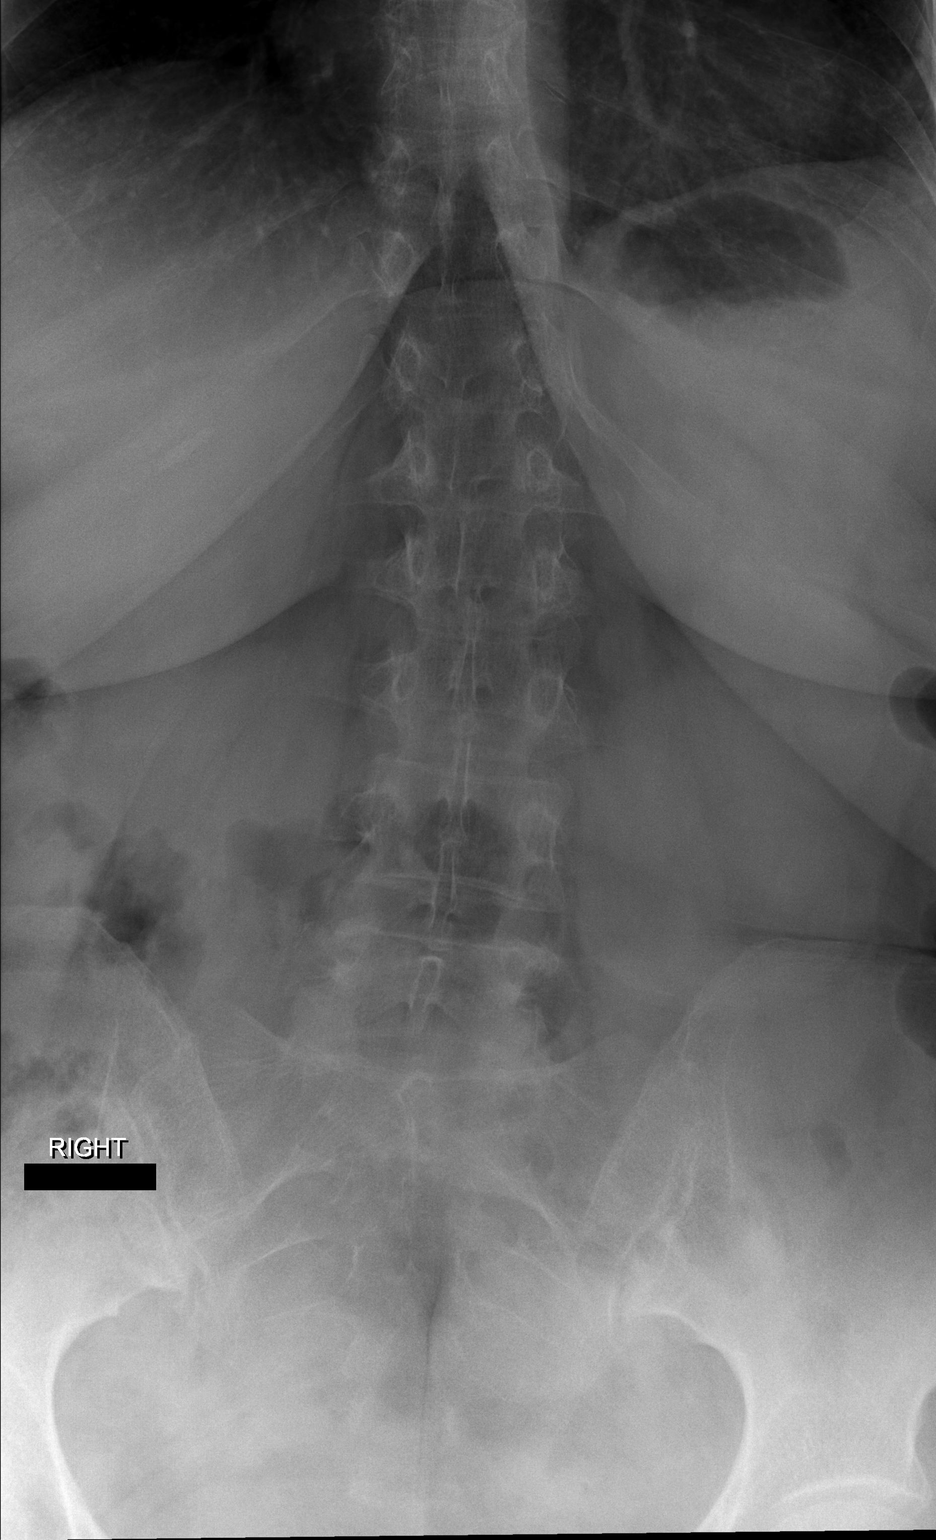

[w lumbar spine lat]
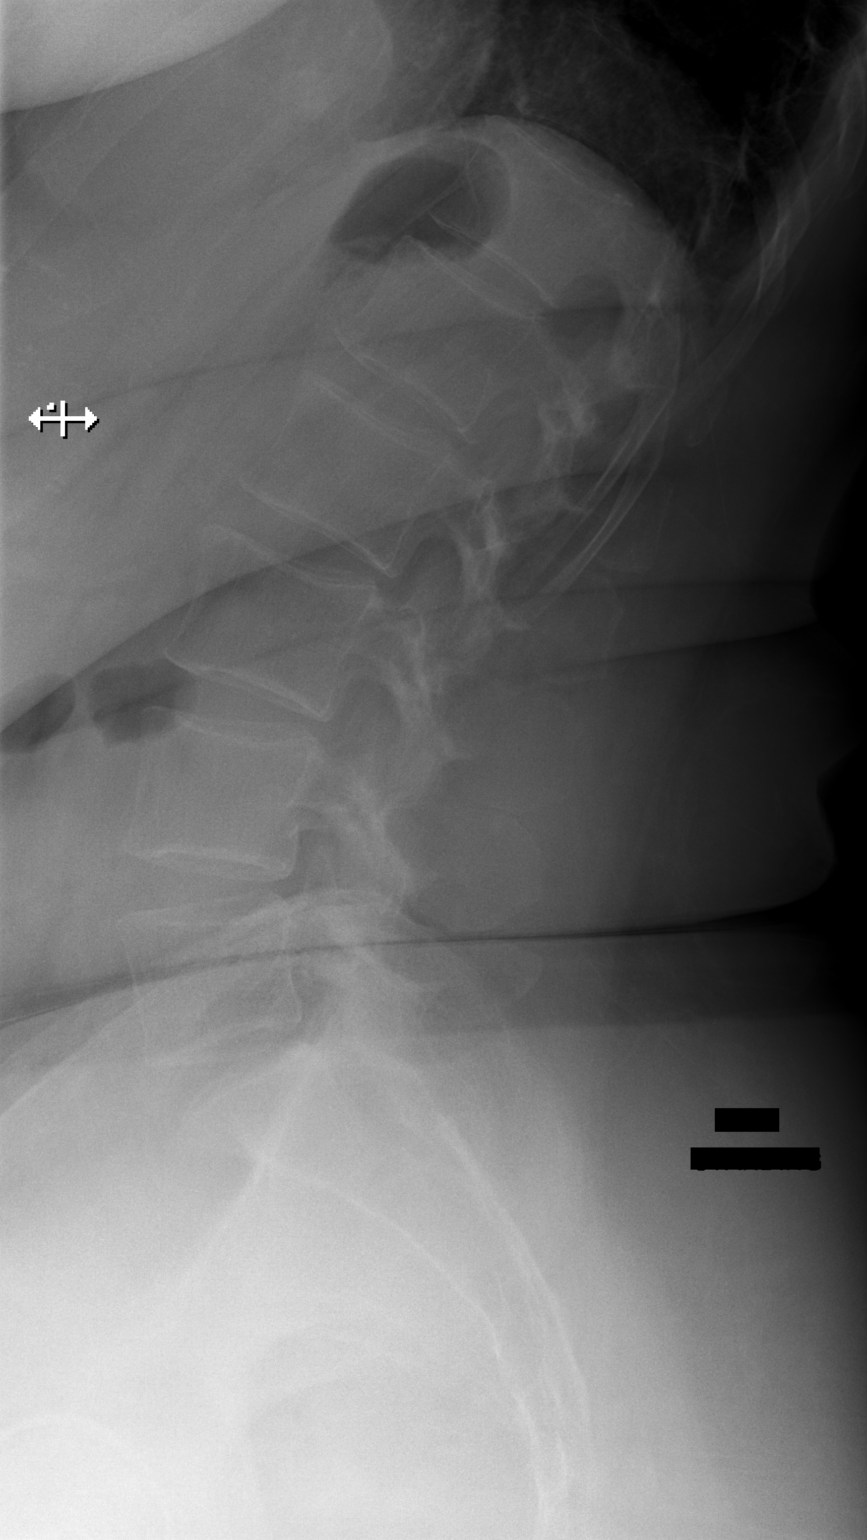

[w lumbar l-5 s-1 spot]
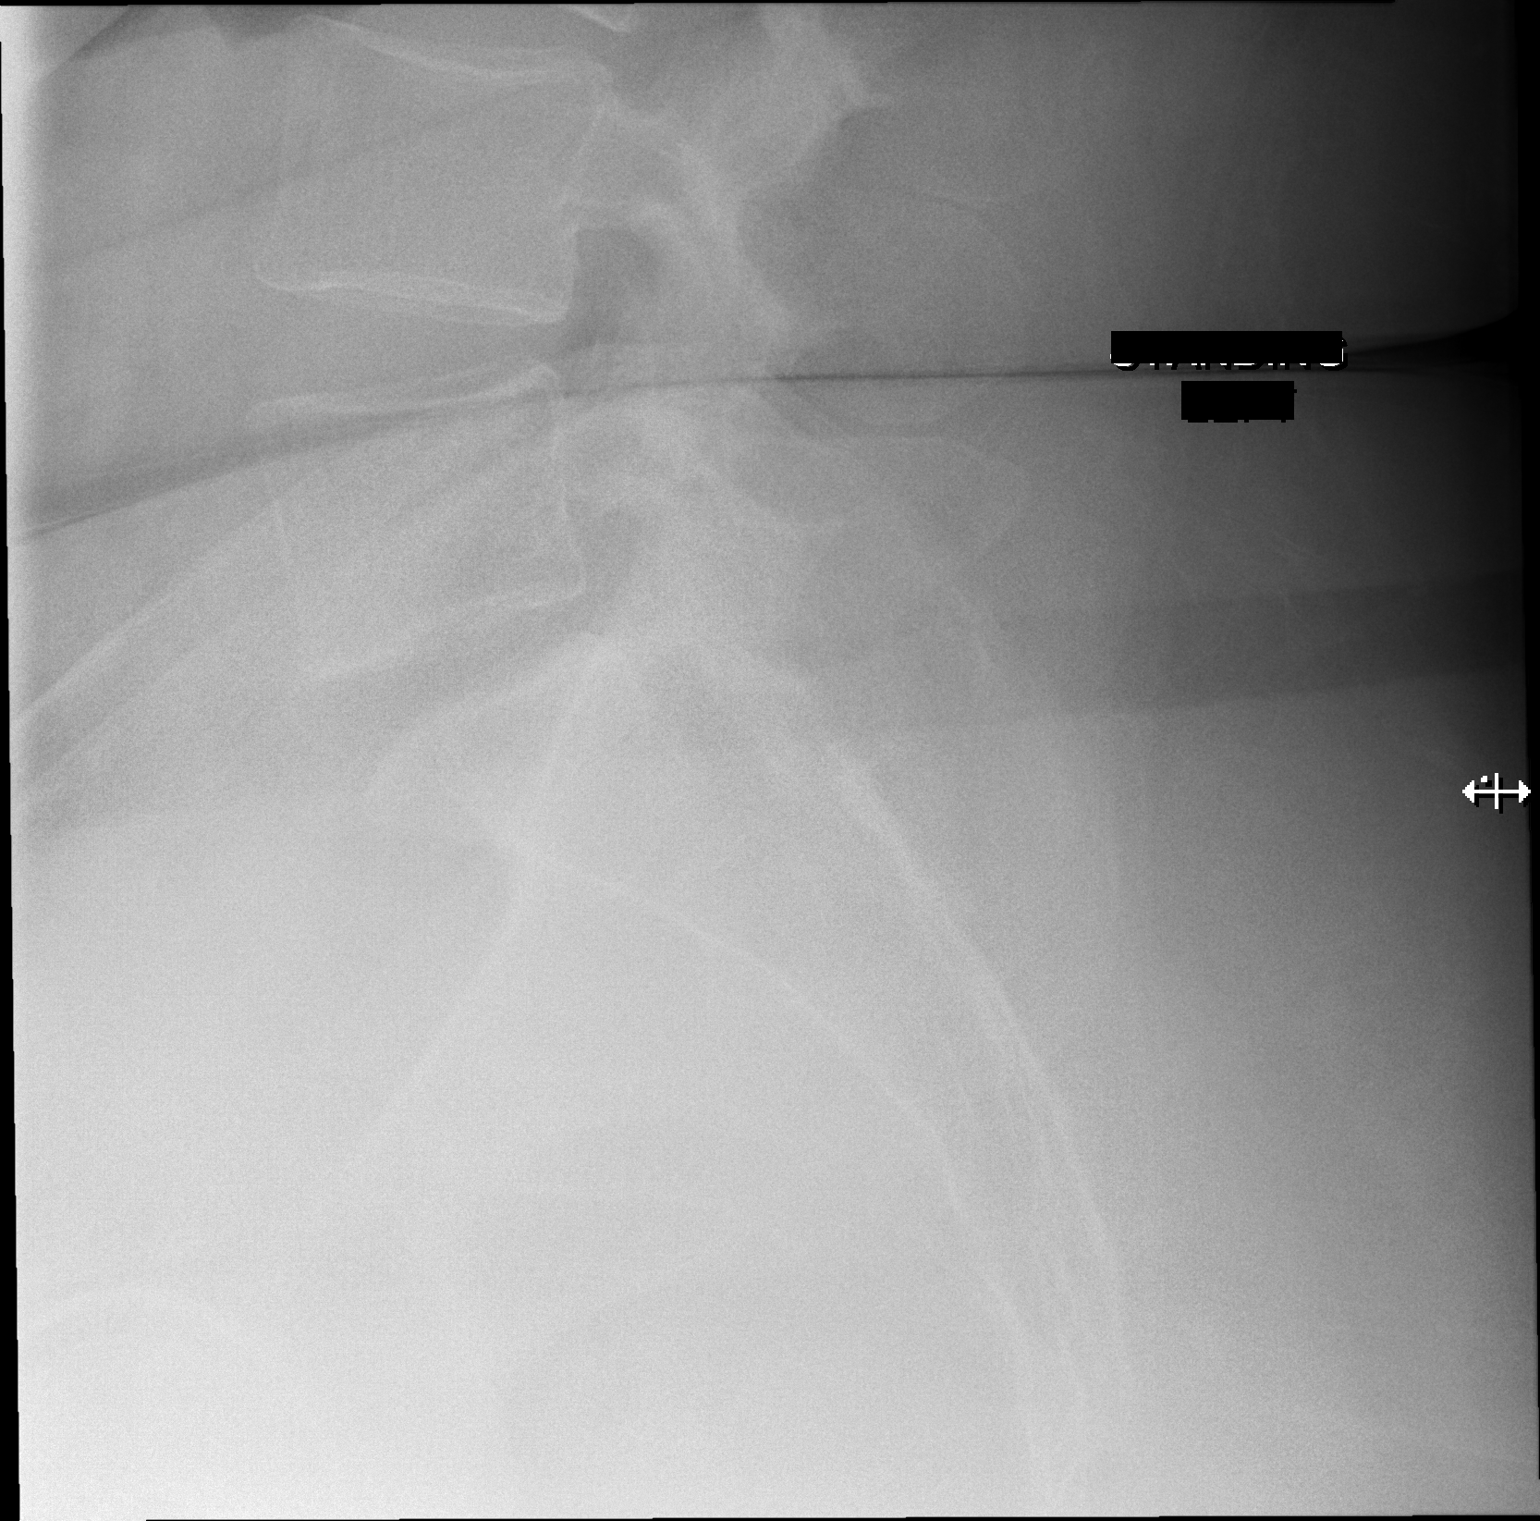

[3 of 3 positions shown; findings below may reference images not displayed]

FINDINGS: Slight levoscoliosis in the lumbar spine. Normal alignment. Mild
degenerative facet disease in the mid and lower lumbar spine. No
fracture. SI joints are symmetric and unremarkable.
IMPRESSION: Mild degenerative facet disease in the lower lumbar spine. No acute
bony abnormality.

## 2019-08-21 DIAGNOSIS — L309 Dermatitis, unspecified: Secondary | ICD-10-CM | POA: Diagnosis not present

## 2019-10-15 DIAGNOSIS — M79672 Pain in left foot: Secondary | ICD-10-CM | POA: Diagnosis not present

## 2019-10-15 DIAGNOSIS — M25572 Pain in left ankle and joints of left foot: Secondary | ICD-10-CM | POA: Diagnosis not present

## 2019-10-15 DIAGNOSIS — M67961 Unspecified disorder of synovium and tendon, right lower leg: Secondary | ICD-10-CM | POA: Diagnosis not present

## 2019-10-15 DIAGNOSIS — M79671 Pain in right foot: Secondary | ICD-10-CM | POA: Diagnosis not present

## 2019-10-15 DIAGNOSIS — M25571 Pain in right ankle and joints of right foot: Secondary | ICD-10-CM | POA: Diagnosis not present

## 2019-12-25 DIAGNOSIS — M545 Low back pain: Secondary | ICD-10-CM | POA: Diagnosis not present

## 2020-02-02 DIAGNOSIS — M79672 Pain in left foot: Secondary | ICD-10-CM | POA: Diagnosis not present

## 2020-02-02 DIAGNOSIS — M722 Plantar fascial fibromatosis: Secondary | ICD-10-CM | POA: Diagnosis not present

## 2020-02-02 DIAGNOSIS — M76822 Posterior tibial tendinitis, left leg: Secondary | ICD-10-CM | POA: Diagnosis not present

## 2020-02-18 DIAGNOSIS — Z86010 Personal history of colonic polyps: Secondary | ICD-10-CM | POA: Insufficient documentation

## 2020-02-18 DIAGNOSIS — R198 Other specified symptoms and signs involving the digestive system and abdomen: Secondary | ICD-10-CM | POA: Diagnosis not present

## 2020-02-18 DIAGNOSIS — K219 Gastro-esophageal reflux disease without esophagitis: Secondary | ICD-10-CM | POA: Insufficient documentation

## 2020-03-01 DIAGNOSIS — Z01411 Encounter for gynecological examination (general) (routine) with abnormal findings: Secondary | ICD-10-CM | POA: Diagnosis not present

## 2020-03-01 DIAGNOSIS — N7689 Other specified inflammation of vagina and vulva: Secondary | ICD-10-CM | POA: Diagnosis not present

## 2020-03-01 DIAGNOSIS — Z6841 Body Mass Index (BMI) 40.0 and over, adult: Secondary | ICD-10-CM | POA: Diagnosis not present

## 2020-03-11 DIAGNOSIS — Z23 Encounter for immunization: Secondary | ICD-10-CM | POA: Diagnosis not present

## 2020-03-16 DIAGNOSIS — H6122 Impacted cerumen, left ear: Secondary | ICD-10-CM | POA: Diagnosis not present

## 2020-03-23 DIAGNOSIS — M5116 Intervertebral disc disorders with radiculopathy, lumbar region: Secondary | ICD-10-CM | POA: Diagnosis not present

## 2020-03-23 DIAGNOSIS — M419 Scoliosis, unspecified: Secondary | ICD-10-CM | POA: Diagnosis not present

## 2020-04-03 DIAGNOSIS — Z23 Encounter for immunization: Secondary | ICD-10-CM | POA: Diagnosis not present

## 2020-05-10 DIAGNOSIS — Z86010 Personal history of colonic polyps: Secondary | ICD-10-CM | POA: Diagnosis not present

## 2020-05-10 DIAGNOSIS — K219 Gastro-esophageal reflux disease without esophagitis: Secondary | ICD-10-CM | POA: Diagnosis not present

## 2020-05-10 DIAGNOSIS — K635 Polyp of colon: Secondary | ICD-10-CM | POA: Diagnosis not present

## 2020-05-10 DIAGNOSIS — Z1211 Encounter for screening for malignant neoplasm of colon: Secondary | ICD-10-CM | POA: Diagnosis not present

## 2020-05-10 DIAGNOSIS — K3189 Other diseases of stomach and duodenum: Secondary | ICD-10-CM | POA: Diagnosis not present

## 2020-05-27 DIAGNOSIS — W57XXXA Bitten or stung by nonvenomous insect and other nonvenomous arthropods, initial encounter: Secondary | ICD-10-CM | POA: Diagnosis not present

## 2020-05-27 DIAGNOSIS — S90861A Insect bite (nonvenomous), right foot, initial encounter: Secondary | ICD-10-CM | POA: Diagnosis not present

## 2020-06-03 DIAGNOSIS — Z1231 Encounter for screening mammogram for malignant neoplasm of breast: Secondary | ICD-10-CM | POA: Diagnosis not present

## 2020-07-07 DIAGNOSIS — M722 Plantar fascial fibromatosis: Secondary | ICD-10-CM | POA: Diagnosis not present

## 2020-07-07 DIAGNOSIS — M67962 Unspecified disorder of synovium and tendon, left lower leg: Secondary | ICD-10-CM | POA: Diagnosis not present

## 2020-08-13 DIAGNOSIS — K581 Irritable bowel syndrome with constipation: Secondary | ICD-10-CM | POA: Diagnosis not present

## 2020-08-13 DIAGNOSIS — K219 Gastro-esophageal reflux disease without esophagitis: Secondary | ICD-10-CM | POA: Diagnosis not present

## 2020-10-20 DIAGNOSIS — M1711 Unilateral primary osteoarthritis, right knee: Secondary | ICD-10-CM | POA: Diagnosis not present

## 2020-10-20 DIAGNOSIS — M238X1 Other internal derangements of right knee: Secondary | ICD-10-CM | POA: Diagnosis not present

## 2020-11-30 DIAGNOSIS — H6123 Impacted cerumen, bilateral: Secondary | ICD-10-CM | POA: Diagnosis not present

## 2021-01-27 DIAGNOSIS — M238X1 Other internal derangements of right knee: Secondary | ICD-10-CM | POA: Diagnosis not present

## 2021-02-17 DIAGNOSIS — M25561 Pain in right knee: Secondary | ICD-10-CM | POA: Diagnosis not present

## 2021-02-24 DIAGNOSIS — S83231A Complex tear of medial meniscus, current injury, right knee, initial encounter: Secondary | ICD-10-CM | POA: Diagnosis not present

## 2021-02-24 DIAGNOSIS — Z6841 Body Mass Index (BMI) 40.0 and over, adult: Secondary | ICD-10-CM | POA: Diagnosis not present

## 2021-02-24 DIAGNOSIS — M1711 Unilateral primary osteoarthritis, right knee: Secondary | ICD-10-CM | POA: Diagnosis not present

## 2021-02-25 DIAGNOSIS — R221 Localized swelling, mass and lump, neck: Secondary | ICD-10-CM | POA: Diagnosis not present

## 2021-02-25 DIAGNOSIS — Z6841 Body Mass Index (BMI) 40.0 and over, adult: Secondary | ICD-10-CM | POA: Diagnosis not present

## 2021-02-25 DIAGNOSIS — K219 Gastro-esophageal reflux disease without esophagitis: Secondary | ICD-10-CM | POA: Diagnosis not present

## 2021-03-11 DIAGNOSIS — E042 Nontoxic multinodular goiter: Secondary | ICD-10-CM | POA: Diagnosis not present

## 2021-03-11 DIAGNOSIS — R221 Localized swelling, mass and lump, neck: Secondary | ICD-10-CM | POA: Diagnosis not present

## 2021-03-21 DIAGNOSIS — Z6841 Body Mass Index (BMI) 40.0 and over, adult: Secondary | ICD-10-CM | POA: Diagnosis not present

## 2021-03-21 DIAGNOSIS — Z01419 Encounter for gynecological examination (general) (routine) without abnormal findings: Secondary | ICD-10-CM | POA: Diagnosis not present

## 2021-04-01 ENCOUNTER — Encounter (HOSPITAL_COMMUNITY): Payer: Self-pay | Admitting: Emergency Medicine

## 2021-04-01 ENCOUNTER — Emergency Department (HOSPITAL_COMMUNITY)
Admission: EM | Admit: 2021-04-01 | Discharge: 2021-04-01 | Disposition: A | Discharge status: Home / Self Care | Payer: BC Managed Care – PPO | Attending: Emergency Medicine | Admitting: Emergency Medicine

## 2021-04-01 ENCOUNTER — Other Ambulatory Visit: Payer: Self-pay

## 2021-04-01 DIAGNOSIS — I83899 Varicose veins of unspecified lower extremities with other complications: Secondary | ICD-10-CM | POA: Diagnosis not present

## 2021-04-01 DIAGNOSIS — I83891 Varicose veins of right lower extremities with other complications: Secondary | ICD-10-CM | POA: Diagnosis not present

## 2021-04-01 LAB — BASIC METABOLIC PANEL
Anion gap: 4 — ABNORMAL LOW (ref 5–15)
BUN: 21 mg/dL — ABNORMAL HIGH (ref 6–20)
CO2: 26 mmol/L (ref 22–32)
Calcium: 9.2 mg/dL (ref 8.9–10.3)
Chloride: 109 mmol/L (ref 98–111)
Creatinine, Ser: 0.87 mg/dL (ref 0.44–1.00)
GFR, Estimated: >60 mL/min (ref >60)
Glucose, Bld: 124 mg/dL — ABNORMAL HIGH (ref 70–99)
Potassium: 3.8 mmol/L (ref 3.5–5.1)
Sodium: 139 mmol/L (ref 135–145)

## 2021-04-01 LAB — CBC WITH DIFFERENTIAL/PLATELET
Abs Immature Granulocytes: 0.01 10*3/uL (ref 0.00–0.07)
Basophils Absolute: 0 10*3/uL (ref 0.0–0.1)
Basophils Relative: 1 %
Eosinophils Absolute: 0.2 10*3/uL (ref 0.0–0.5)
Eosinophils Relative: 2 %
HCT: 35.1 % — ABNORMAL LOW (ref 36.0–46.0)
Hemoglobin: 11.5 g/dL — ABNORMAL LOW (ref 12.0–15.0)
Immature Granulocytes: 0 %
Lymphocytes Relative: 21 %
Lymphs Abs: 1.6 10*3/uL (ref 0.7–4.0)
MCH: 31.7 pg (ref 26.0–34.0)
MCHC: 32.8 g/dL (ref 30.0–36.0)
MCV: 96.7 fL (ref 80.0–100.0)
Monocytes Absolute: 0.6 10*3/uL (ref 0.1–1.0)
Monocytes Relative: 8 %
Neutro Abs: 5.2 10*3/uL (ref 1.7–7.7)
Neutrophils Relative %: 68 %
Platelets: 281 10*3/uL (ref 150–400)
RBC: 3.63 MIL/uL — ABNORMAL LOW (ref 3.87–5.11)
RDW: 13.7 % (ref 11.5–15.5)
WBC: 7.6 10*3/uL (ref 4.0–10.5)
nRBC: 0 % (ref 0.0–0.2)

## 2021-04-01 LAB — PROTIME-INR
INR: 0.9 (ref 0.8–1.2)
Prothrombin Time: 12.4 seconds (ref 11.4–15.2)

## 2021-04-01 NOTE — Discharge Instructions (Addendum)
If bleeding starts again apply direct pressure for at least 10 minutes.  Do not let pressure up for 10 minutes and then recheck bleeding.  If you can get bleeding to stop call EMS and report to the hospital.  For grooming and bathing try and keep this area covered with bandage.  He could apply an adhesive bandage and then wrap with Saran wrap and then cover with a towel and trash bag to keep it water resistant.  Follow-up with your primary care provider for maintenance related to vascular changes of the lower extremity.  Return to the emergency department any acute changes or concerns

## 2021-04-01 NOTE — ED Triage Notes (Signed)
Patient reports blood vessel at left ankle suddenly ruptured and bled this evening , denies injury , no anticoagulant medication , dressing applied prior to arrival - bleeding controlled .

## 2021-04-01 NOTE — ED Notes (Signed)
Pressure dressing applied by EDP at triage.

## 2021-04-01 NOTE — ED Provider Notes (Signed)
[facility] EMERGENCY DEPARTMENT Provider Note   CSN: [id number] Arrival date & time: 04/01/21  2228     History No chief complaint on file.   Dorothy Gibson is a 54 y.o. female.  Leg bleeding, from an abnormal looking vein in her leg.  No blood thinners no bleeding disorders.  No trauma noted.  EMS applied pressure and bleeding stopped.  No pain.  No skin color changes.  No recent fevers chills.  No history of bleeding like this.        Past Medical History:  Diagnosis Date  . Anemia   . Arthritis   . Complication of anesthesia    broke out with laughing gas   . Dysrhythmia    pvc's adn Avc's  . GERD (gastroesophageal reflux disease)   . Obesity   . PVC's (premature ventricular contractions)     Patient Active Problem List   Diagnosis Date Noted  . HNP (herniated nucleus pulposus), lumbar 03/28/2018  . Spinal stenosis, lumbar 03/28/2018    Past Surgical History:  Procedure Laterality Date  . CESAREAN SECTION  1996 1999  . COLONOSCOPY    . LUMBAR LAMINECTOMY/DECOMPRESSION MICRODISCECTOMY Left 03/28/2018   Procedure: Microlumbar decompression Lumbar five-Sacral one left;  Surgeon: Jene Every, MD;  Location: [facility] OR;  Service: Orthopedics;  Laterality: Left;     OB History   No obstetric history on file.     Family History  Problem Relation Age of Onset  . Lung cancer Father     Social History   Tobacco Use  . Smoking status: Never Smoker  . Smokeless tobacco: Never Used  Vaping Use  . Vaping Use: Never used  Substance Use Topics  . Alcohol use: Not Currently    Alcohol/week: 0.0 standard drinks  . Drug use: Never    Home Medications Prior to Admission medications   Medication Sig Start Date End Date Taking? Authorizing Provider  Cyanocobalamin (B-12) 2500 MCG SUBL Place 2,500 mcg under the tongue daily.    [provider]  cycloSPORINE (RESTASIS) 0.05 % ophthalmic emulsion Place 1 drop into both eyes at  bedtime.     [provider]  docusate sodium (COLACE) 100 MG capsule Take 1 capsule (100 mg total) by mouth 2 (two) times daily. 03/29/18   Dorothy Spark, PA-C  gabapentin (NEURONTIN) 300 MG capsule Take 300 mg by mouth 2 (two) times daily.    [provider]  methocarbamol (ROBAXIN) 500 MG tablet Take 1 tablet (500 mg total) by mouth every 6 (six) hours as needed for muscle spasms. 03/29/18   Dorothy Spark, PA-C  Multiple Vitamin (MULTIVITAMIN WITH MINERALS) TABS tablet Take 1 tablet by mouth daily. [employer] WOMEN'S MULTIVITAMIN PLUS    [provider]  nystatin cream (MYCOSTATIN) Apply 1 application topically 2 (two) times daily as needed for dry skin.    [provider]  nystatin-triamcinolone (MYCOLOG II) cream Apply 1 application topically 2 (two) times daily as needed (for skin irriation.).    [provider]  omeprazole (PRILOSEC) 40 MG capsule Take 40 mg by mouth daily as needed (for heartburn).    [provider]  oxyCODONE (OXY IR/ROXICODONE) 5 MG immediate release tablet Take 1-2 tablets (5-10 mg total) by mouth every 4 (four) hours as needed for moderate pain or severe pain. 03/29/18   Dorothy Spark, PA-C  polyethylene glycol (MIRALAX / GLYCOLAX) packet Take 17 g by mouth daily as needed for mild constipation. 03/29/18  Andrez Grime M, PA-C  propranolol (INDERAL) 20 MG tablet Take 20 mg by mouth daily as needed (for premature ventricular contraction.).     [provider]  propranolol ER (INDERAL LA) 160 MG SR capsule Take 160 mg by mouth at bedtime.     [provider]  Zinc 50 MG CAPS Take 50 mg by mouth daily.    [provider]  Zinc Oxide (BOUDREAUXS BUTT PASTE EX) Apply 1 application topically 2 (two) times daily as needed (for skin irritation/hemmorroids.).    [provider]    Allergies    Ivp dye [iodinated diagnostic agents], Other, Diltiazem, Feldene [piroxicam], Latex, and  Neosporin [neomycin-bacitracin zn-polymyx]  Review of Systems   Review of Systems  Constitutional: Negative for chills and fever.  HENT: Negative for congestion and rhinorrhea.   Respiratory: Negative for cough and shortness of breath.   Cardiovascular: Negative for chest pain and palpitations.  Gastrointestinal: Negative for diarrhea, nausea and vomiting.  Genitourinary: Negative for difficulty urinating and dysuria.  Musculoskeletal: Positive for arthralgias (chronic). Negative for back pain.  Skin: Positive for wound. Negative for rash.  Neurological: Negative for light-headedness and headaches.    Physical Exam Updated Vital Signs BP 119/74 (BP Location: Left Arm)   Pulse 79   Temp 98.2 F (36.8 C) (Oral)   Resp 15   Ht 5\' 7"  (1.702 m)   Wt 125 kg   SpO2 98%   BMI 43.16 kg/m   Physical Exam Vitals and nursing note reviewed. Exam conducted with a chaperone present.  Constitutional:      General: She is not in acute distress.    Appearance: Normal appearance.  HENT:     Head: Normocephalic and atraumatic.     Nose: No rhinorrhea.  Eyes:     General:        Right eye: No discharge.        Left eye: No discharge.     Conjunctiva/sclera: Conjunctivae normal.  Cardiovascular:     Rate and Rhythm: Normal rate and regular rhythm.  Pulmonary:     Effort: Pulmonary effort is normal. No respiratory distress.     Breath sounds: No stridor.  Abdominal:     General: Abdomen is flat. There is no distension.     Palpations: Abdomen is soft.  Musculoskeletal:        General: No tenderness or signs of injury.     Comments: Small punctate eschar on top of abnormal venous changes to superficial veins.  No active bleeding.  Neurovascularly intact distal.  No bony tenderness.  Able to ambulate without causing bleeding.  Skin:    General: Skin is warm and dry.  Neurological:     General: No focal deficit present.     Mental Status: She is alert. Mental status is at baseline.      Motor: No weakness.  Psychiatric:        Mood and Affect: Mood normal.        Behavior: Behavior normal.     ED Results / Procedures / Treatments   Labs (all labs ordered are listed, but only abnormal results are displayed) Labs Reviewed  CBC WITH DIFFERENTIAL/PLATELET - Abnormal; Notable for the following components:      Result Value   RBC 3.63 (*)    Hemoglobin 11.5 (*)    HCT 35.1 (*)    All other components within normal limits  BASIC METABOLIC PANEL - Abnormal; Notable for the following components:  Glucose, Bld 124 (*)    BUN 21 (*)    Anion gap 4 (*)    All other components within normal limits  PROTIME-INR    EKG None  Radiology No results found.  Procedures Procedures   Medications Ordered in ED Medications - No data to display  ED Course  I have reviewed the triage vital signs and the nursing notes.  Pertinent labs & imaging results that were available during my care of the patient were reviewed by me and considered in my medical decision making (see chart for details).    MDM Rules/Calculators/A&P                          Pressure dressing was applied to bleeding pain.  Stopped bleeding even with ambulation.  Laboratory studies sent and reviewed by myself are unremarkable.  She is safe for discharge home with return precautions wound care and outpatient follow-up recommended Final Clinical Impression(s) / ED Diagnoses Final diagnoses:  Bleeding from varicose vein    Rx / DC Orders ED Discharge Orders    None       Sabino Donovan, MD 04/02/21 0013

## 2021-04-01 NOTE — ED Provider Notes (Signed)
MSE was initiated and I personally evaluated the patient and placed orders (if any) at  11:08 PM on April 01, 2021.  The patient appears stable so that the remainder of the MSE may be completed by another provider.  Patient is here with spontaneous rupture of varicose vein on the right lower extremity.  No blood thinners.  Family history of varicose veins.  She said it was squirting out blood.  On my assessment it was hemostatic.  She is able to move her toes she had sensation she had palpable pulses distal to the small punctate area of hemorrhage on the medial aspect of the distal right leg.  Pressure dressing is applied patient will need to ambulate to make sure it does not start to bleed labs have been sent by triage they will need to be reassessed.     Sabino Donovan, MD 04/01/21 [phone]

## 2021-04-01 NOTE — ED Notes (Addendum)
Dr.Katz at triage evaluating patient at this time.

## 2021-04-04 DIAGNOSIS — I8393 Asymptomatic varicose veins of bilateral lower extremities: Secondary | ICD-10-CM | POA: Diagnosis not present

## 2021-04-12 DIAGNOSIS — Z6841 Body Mass Index (BMI) 40.0 and over, adult: Secondary | ICD-10-CM | POA: Diagnosis not present

## 2021-04-12 DIAGNOSIS — Z131 Encounter for screening for diabetes mellitus: Secondary | ICD-10-CM | POA: Diagnosis not present

## 2021-04-12 DIAGNOSIS — Z13220 Encounter for screening for lipoid disorders: Secondary | ICD-10-CM | POA: Diagnosis not present

## 2021-04-12 DIAGNOSIS — I493 Ventricular premature depolarization: Secondary | ICD-10-CM | POA: Diagnosis not present

## 2021-04-12 DIAGNOSIS — R739 Hyperglycemia, unspecified: Secondary | ICD-10-CM | POA: Diagnosis not present

## 2021-04-12 DIAGNOSIS — D649 Anemia, unspecified: Secondary | ICD-10-CM | POA: Diagnosis not present

## 2021-04-12 DIAGNOSIS — Z79899 Other long term (current) drug therapy: Secondary | ICD-10-CM | POA: Diagnosis not present

## 2021-04-19 ENCOUNTER — Other Ambulatory Visit: Payer: Self-pay

## 2021-04-19 DIAGNOSIS — I83899 Varicose veins of unspecified lower extremities with other complications: Secondary | ICD-10-CM

## 2021-04-28 DIAGNOSIS — Z713 Dietary counseling and surveillance: Secondary | ICD-10-CM | POA: Diagnosis not present

## 2021-04-29 ENCOUNTER — Other Ambulatory Visit: Payer: Self-pay

## 2021-04-29 ENCOUNTER — Encounter: Payer: Self-pay | Admitting: Physician Assistant

## 2021-04-29 ENCOUNTER — Ambulatory Visit: Payer: BC Managed Care – PPO | Admitting: Physician Assistant

## 2021-04-29 ENCOUNTER — Ambulatory Visit (HOSPITAL_COMMUNITY)
Admission: RE | Admit: 2021-04-29 | Discharge: 2021-04-29 | Disposition: A | Discharge status: Home / Self Care | Payer: BC Managed Care – PPO | Source: Ambulatory Visit | Attending: Vascular Surgery | Admitting: Vascular Surgery

## 2021-04-29 VITALS — BP 110/73 | HR 55 | Temp 98.2°F | Resp 20 | Ht 67.0 in | Wt 250.0 lb

## 2021-04-29 DIAGNOSIS — I83891 Varicose veins of right lower extremities with other complications: Secondary | ICD-10-CM

## 2021-04-29 DIAGNOSIS — I8393 Asymptomatic varicose veins of bilateral lower extremities: Secondary | ICD-10-CM

## 2021-04-29 DIAGNOSIS — I83899 Varicose veins of unspecified lower extremities with other complications: Secondary | ICD-10-CM | POA: Insufficient documentation

## 2021-04-29 DIAGNOSIS — I872 Venous insufficiency (chronic) (peripheral): Secondary | ICD-10-CM

## 2021-04-29 NOTE — Progress Notes (Signed)
Office Note     CC:  follow up Requesting Provider:  Lenell Antu, DO  HPI: Dorothy Gibson is a 55 y.o. ([birth year]) female who presents for evaluation of bleeding surface vein and right medial ankle.  She had her first and only bleeding episode from a surface vein of the right medial ankle on April 01, 2021.  She went to the emergency department for hemostasis and a compression wrap.  She was seen by her primary care doctor a week later it was uncomfortable removing the dressing.  She presents today 4 weeks after the bleeding episode with the same dressing from the emergency department.  She denies any history of DVT, venous ulceration, trauma, or prior vascular intervention.   She has not made an effort to wear compression or elevate her legs during the day.  She states her mother suffered with varicose veins for most of her life.  She has a torn meniscus in her right knee and is being seen by Dr. Constance Goltz of [facility] for potential right knee replacement.  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  She denies tobacco use.  Past Medical History:  Diagnosis Date  . Anemia   . Arthritis   . Complication of anesthesia    broke out with laughing gas   . Dysrhythmia    pvc's adn Avc's  . GERD (gastroesophageal reflux disease)   . Obesity   . PVC's (premature ventricular contractions)     Past Surgical History:  Procedure Laterality Date  . CESAREAN SECTION  1996 1999  . COLONOSCOPY    . LUMBAR LAMINECTOMY/DECOMPRESSION MICRODISCECTOMY Left 03/28/2018   Procedure: Microlumbar decompression Lumbar five-Sacral one left;  Surgeon: Jene Every, MD;  Location: [facility] OR;  Service: Orthopedics;  Laterality: Left;    Social History   Socioeconomic History  . Marital status: Married    Spouse name: Not on file  . Number of children: Not on file  . Years of education: Not on file  . Highest education level: Not on file  Occupational History  . Not on file   Tobacco Use  . Smoking status: Never Smoker  . Smokeless tobacco: Never Used  Vaping Use  . Vaping Use: Never used  Substance and Sexual Activity  . Alcohol use: Not Currently    Alcohol/week: 0.0 standard drinks  . Drug use: Never  . Sexual activity: Not on file  Other Topics Concern  . Not on file  Social History Narrative  . Not on file   Social Determinants of Health   Financial Resource Strain: Not on file  Food Insecurity: Not on file  Transportation Needs: Not on file  Physical Activity: Not on file  Stress: Not on file  Social Connections: Not on file  Intimate Partner Violence: Not on file    Family History  Problem Relation Age of Onset  . Lung cancer Father     Current Outpatient Medications  Medication Sig Dispense Refill  . Cyanocobalamin (B-12) 2500 MCG SUBL Place 2,500 mcg under the tongue daily.    . cycloSPORINE (RESTASIS) 0.05 % ophthalmic emulsion Place 1 drop into both eyes at bedtime.     . docusate sodium (COLACE) 100 MG capsule Take 1 capsule (100 mg total) by mouth 2 (two) times daily. 40 capsule 0  . gabapentin (NEURONTIN) 300 MG capsule Take 300 mg by mouth 2 (two) times daily.    . Lactobacillus-Inulin (CULTURELLE DIGESTIVE DAILY PO) Take by mouth.    Marland Kitchen  methocarbamol (ROBAXIN) 500 MG tablet Take 1 tablet (500 mg total) by mouth every 6 (six) hours as needed for muscle spasms. 40 tablet 1  . Multiple Vitamin (MULTIVITAMIN WITH MINERALS) TABS tablet Take 1 tablet by mouth daily. [employer] WOMEN'S MULTIVITAMIN PLUS    . nystatin cream (MYCOSTATIN) Apply 1 application topically 2 (two) times daily as needed for dry skin.    Marland Kitchen nystatin-triamcinolone (MYCOLOG II) cream Apply 1 application topically 2 (two) times daily as needed (for skin irriation.).    Marland Kitchen Omega-3 Fatty Acids (FISH OIL) 1200 MG CAPS Fish Oil 1,200 mg-144 mg-216 mg capsule    . omeprazole (PRILOSEC) 40 MG capsule Take 40 mg by mouth daily as needed (for heartburn).    Marland Kitchen oxyCODONE (OXY  IR/ROXICODONE) 5 MG immediate release tablet Take 1-2 tablets (5-10 mg total) by mouth every 4 (four) hours as needed for moderate pain or severe pain. 40 tablet 0  . polyethylene glycol (MIRALAX / GLYCOLAX) packet Take 17 g by mouth daily as needed for mild constipation. 14 each 0  . propranolol (INDERAL) 20 MG tablet Take 20 mg by mouth daily as needed (for premature ventricular contraction.).     Marland Kitchen propranolol ER (INDERAL LA) 160 MG SR capsule Take 160 mg by mouth at bedtime.     . Psyllium (METAMUCIL) 28.3 % POWD     . Zinc 50 MG CAPS Take 50 mg by mouth daily.    . Zinc Oxide (BOUDREAUXS BUTT PASTE EX) Apply 1 application topically 2 (two) times daily as needed (for skin irritation/hemmorroids.).     No current facility-administered medications for this visit.    Allergies  Allergen Reactions  . Ivp Dye [Iodinated Diagnostic Agents] Anaphylaxis and Palpitations  . Other Dermatitis    Paper tape or bandaids causes skin redness/blotchiness Patient states she tolerates adhesive tape  . Diltiazem Rash and Other (See Comments)    Rash and constipation   . Feldene [Piroxicam] Rash  . Latex Rash  . Neosporin [Neomycin-Bacitracin Zn-Polymyx] Rash and Other (See Comments)    Red/puffiness/skin irritation.     REVIEW OF SYSTEMS:   [X]  denotes positive finding, [ ]  denotes negative finding Cardiac  Comments:  Chest pain or chest pressure:    Shortness of breath upon exertion:    Short of breath when lying flat:    Irregular heart rhythm:        Vascular    Pain in calf, thigh, or hip brought on by ambulation:    Pain in feet at night that wakes you up from your sleep:     Blood clot in your veins:    Leg swelling:         Pulmonary    Oxygen at home:    Productive cough:     Wheezing:         Neurologic    Sudden weakness in arms or legs:     Sudden numbness in arms or legs:     Sudden onset of difficulty speaking or slurred speech:    Temporary loss of vision in one eye:      Problems with dizziness:         Gastrointestinal    Blood in stool:     Vomited blood:         Genitourinary    Burning when urinating:     Blood in urine:        Psychiatric    Major depression:  Hematologic    Bleeding problems:    Problems with blood clotting too easily:        Skin    Rashes or ulcers:        Constitutional    Fever or chills:      PHYSICAL EXAMINATION:  Vitals:   04/29/21 1441  BP: 110/73  Pulse: (!) 55  Resp: 20  Temp: 98.2 F (36.8 C)  TempSrc: Temporal  SpO2: 95%  Weight: 250 lb (113.4 kg)  Height: 5\' 7"  (1.702 m)    General:  WDWN in NAD; vital signs documented above Gait: Not observed HENT: WNL, normocephalic Pulmonary: normal non-labored breathing Cardiac: regular HR Abdomen: soft, NT, no masses Skin: without rashes Vascular Exam/Pulses:  Right Left  Radial 2+ (normal) 2+ (normal)  DP 2+ (normal) 2+ (normal)   Extremities: She has pitting edema to the level of the proximal shin bilaterally with scattered surface veins and varicosities; dry eschar overlying surface vein from prior bleeding episode in her left medial lower ankle Musculoskeletal: no muscle wasting or atrophy  Neurologic: A&O X 3;  No focal weakness or paresthesias are detected Psychiatric:  The pt has Normal affect.   Non-Invasive Vascular Imaging:   Right lower extremity venous reflux study negative for DVT Deep venous reflux in popliteal and common femoral vein Superficial venous reflux in the greater saphenous vein and small saphenous vein however right great saphenous vein is small in diameter throughout the thigh    ASSESSMENT/PLAN:: 55 y.o. female here for evaluation of right lower extremity edema and bleeding surface vein  -Dressing was removed from her right ankle and surface vein which bled 4 weeks ago now has a small dry eschar and appears to be healing well -Venous reflux study was negative for DVT however did demonstrate deep and  superficial venous reflux.  The greater saphenous vein was incompetent however had a small diameter throughout the thigh.  Patient states prior to bleeding episode she would not consider her varicose veins and venous symptoms bothersome -Recommendations include wearing knee-high 15 to 20 mmHg compression every day.  I also demonstrated proper elevation of her legs above her heart which she should do periodically throughout the day.  She will also make an effort to avoid prolonged sitting and standing. -Patient was measured for and purchased knee-high compression.  Right leg was also wrapped tightly with a Ace bandage to the level of the knee.  Should she bleed again from this surface vein of the right medial ankle, she will need application of an Unna boot to provide 24/7 compression for maximum of 4 weeks of weekly changes.  Patient can otherwise follow-up on an as-needed basis.  From a vascular surgery standpoint I do not see any contraindication to proceeding with knee replacement surgery   57, PA-C [facility] [phone]  Clinic MD:   [phone]

## 2021-05-05 DIAGNOSIS — I8393 Asymptomatic varicose veins of bilateral lower extremities: Secondary | ICD-10-CM

## 2021-06-08 DIAGNOSIS — Z6841 Body Mass Index (BMI) 40.0 and over, adult: Secondary | ICD-10-CM | POA: Diagnosis not present

## 2021-06-08 DIAGNOSIS — M25561 Pain in right knee: Secondary | ICD-10-CM | POA: Diagnosis not present

## 2021-06-08 DIAGNOSIS — Z723 Lack of physical exercise: Secondary | ICD-10-CM | POA: Diagnosis not present

## 2021-06-14 DIAGNOSIS — L600 Ingrowing nail: Secondary | ICD-10-CM | POA: Diagnosis not present

## 2021-06-14 DIAGNOSIS — Z6841 Body Mass Index (BMI) 40.0 and over, adult: Secondary | ICD-10-CM | POA: Diagnosis not present

## 2021-06-14 DIAGNOSIS — Z1231 Encounter for screening mammogram for malignant neoplasm of breast: Secondary | ICD-10-CM | POA: Diagnosis not present

## 2021-06-22 DIAGNOSIS — Z713 Dietary counseling and surveillance: Secondary | ICD-10-CM | POA: Diagnosis not present

## 2021-07-04 ENCOUNTER — Telehealth: Payer: Self-pay | Admitting: *Deleted

## 2021-07-04 NOTE — Telephone Encounter (Signed)
Patient called stating that the compression stockings (15-20 knee high) was causing a reddened area around her leg, just below her knee.  I left a message to remove stockings and wrap legs with the aces she had used previously and to possibly schedule an appointment for new measurements. Patient is to call and speak to [facility] RN our vein nurse.

## 2021-07-13 DIAGNOSIS — H16223 Keratoconjunctivitis sicca, not specified as Sjogren's, bilateral: Secondary | ICD-10-CM | POA: Diagnosis not present

## 2021-09-02 DIAGNOSIS — Z713 Dietary counseling and surveillance: Secondary | ICD-10-CM | POA: Diagnosis not present

## 2021-09-12 DIAGNOSIS — R42 Dizziness and giddiness: Secondary | ICD-10-CM | POA: Diagnosis not present

## 2021-09-12 DIAGNOSIS — H6123 Impacted cerumen, bilateral: Secondary | ICD-10-CM | POA: Diagnosis not present

## 2021-09-23 DIAGNOSIS — J309 Allergic rhinitis, unspecified: Secondary | ICD-10-CM | POA: Diagnosis not present

## 2021-09-23 DIAGNOSIS — S46819A Strain of other muscles, fascia and tendons at shoulder and upper arm level, unspecified arm, initial encounter: Secondary | ICD-10-CM | POA: Diagnosis not present

## 2021-10-07 DIAGNOSIS — L600 Ingrowing nail: Secondary | ICD-10-CM | POA: Diagnosis not present

## 2021-10-08 DIAGNOSIS — M79674 Pain in right toe(s): Secondary | ICD-10-CM | POA: Diagnosis not present

## 2021-10-24 DIAGNOSIS — K219 Gastro-esophageal reflux disease without esophagitis: Secondary | ICD-10-CM | POA: Diagnosis not present

## 2021-10-24 DIAGNOSIS — I83893 Varicose veins of bilateral lower extremities with other complications: Secondary | ICD-10-CM | POA: Insufficient documentation

## 2021-11-03 DIAGNOSIS — U071 COVID-19: Secondary | ICD-10-CM | POA: Diagnosis not present

## 2021-11-18 DIAGNOSIS — Z6841 Body Mass Index (BMI) 40.0 and over, adult: Secondary | ICD-10-CM | POA: Diagnosis not present

## 2021-11-18 DIAGNOSIS — E785 Hyperlipidemia, unspecified: Secondary | ICD-10-CM | POA: Diagnosis not present

## 2021-11-28 DIAGNOSIS — R002 Palpitations: Secondary | ICD-10-CM | POA: Diagnosis not present

## 2021-11-28 DIAGNOSIS — Z0000 Encounter for general adult medical examination without abnormal findings: Secondary | ICD-10-CM | POA: Diagnosis not present

## 2021-11-28 DIAGNOSIS — M25561 Pain in right knee: Secondary | ICD-10-CM | POA: Diagnosis not present

## 2021-11-28 DIAGNOSIS — R5382 Chronic fatigue, unspecified: Secondary | ICD-10-CM | POA: Diagnosis not present

## 2021-12-02 DIAGNOSIS — Z713 Dietary counseling and surveillance: Secondary | ICD-10-CM | POA: Diagnosis not present

## 2021-12-02 DIAGNOSIS — Z6841 Body Mass Index (BMI) 40.0 and over, adult: Secondary | ICD-10-CM | POA: Diagnosis not present

## 2021-12-16 DIAGNOSIS — M1711 Unilateral primary osteoarthritis, right knee: Secondary | ICD-10-CM | POA: Diagnosis not present

## 2021-12-16 DIAGNOSIS — M25561 Pain in right knee: Secondary | ICD-10-CM | POA: Diagnosis not present

## 2021-12-22 ENCOUNTER — Telehealth: Payer: Self-pay | Admitting: *Deleted

## 2021-12-22 NOTE — Telephone Encounter (Signed)
° °  Patient Name: Dorothy Gibson  DOB: May 17, 1966 MRN: [id number]  Primary Cardiologist: None (previously seen by Dr. Kirke Corin in 2017)  Chart reviewed as part of pre-operative protocol coverage.   Patient has not been seen since 2017. The patient has an upcoming appointment scheduled 01/17/22 at which time this clearance can be addressed in case there are any issues that would impact surgical clearance. Preop clearance is already listed in the appt notes.  Per office protocol, the cardiology provider should forward their finalized clearance decision to requesting party below. I will route this message as FYI to requesting party and remove this message from the pre-op box as separate preop APP input not needed at this time.  Please call with questions.  Laurann Montana, PA-C 12/22/2021, 11:25 AM

## 2021-12-22 NOTE — Telephone Encounter (Signed)
° °  Pre-operative Risk Assessment    Patient Name: Dorothy Gibson  DOB: 04-07-1966 MRN: [id number]      Request for Surgical Clearance    Procedure:   right total knee arthroplasty  at  [facility] --- pre op date 01/13/22  Date of Surgery:  Clearance 02/09/22                                 Surgeon:   Dr Durene Romans Surgeon's Group or Practice Name:  [facility]  Phone number:  [phone] attn Rosalva Ferron Fax number:  [phone] attn Rosalva Ferron   Type of Clearance Requested:   - Medical    Type of Anesthesia:  Spinal   Additional requests/questions:  Please advise surgeon/provider what medications should be held.   Cala Bradford   12/22/2021, 8:42 AM

## 2022-01-04 NOTE — Progress Notes (Signed)
Cardiology Office Note:   Date:  01/05/2022  NAME:  Dorothy Gibson    MRN: [id number] DOB:  1966/07/13   PCP:  Lenell Antu, DO  Cardiologist:  None  Electrophysiologist:  None   Referring MD: Durene Romans, MD   Chief Complaint  Patient presents with   Pre-op Exam         History of Present Illness:   Dorothy Gibson is a 56 y.o. female with a hx of PVCs who is being seen today for the evaluation of preoperative assessment at the request of Le, Thao P, DO.  She will have right knee surgery in February.  She presents for preoperative assessment.  History of PVCs.  Normal stress test and echo in the past.  She has been treated on propranolol for PVCs.  Has not seen cardiology in roughly 6 years.  She is on propranolol extended release 160 daily.  She does report some dizziness and lightheadedness.  Blood pressure values are quite low.  She is also been treated for dizziness and an inner ear infection.  We discussed that her dizziness could be related to propranolol.  We discussed going down to 120 mg daily.  She is okay to do this.  She describes palpable chest pain.  She reports tightness in her chest with tight fitting bras.  I suspect this is musculoskeletal.  She does not have chest pain with exertion.  No significant shortness of breath.  She is not that active due to right knee arthritis.  She is a never smoker.  She does not drink alcohol or use drugs.  There is no strong family history of heart disease in her side of the family.  Most recent labs show LDL cholesterol 131.  She is not diabetic with an A1c of 6.3.  She can complete activity around the house.  She walked in the building today without limitations.  Cardiovascular examination is normal.  EKG is normal.  Regarding her PVCs she can notice them possibly every 4 to 6 weeks.  She reports symptoms do occur despite being on propranolol.  She reports other medications have not helped.  Problem List PVCs -normal echo  -normal  MPI HLD -T chol 211, HDL 63, LDL 131, TG 84 3. Obesity -BMI 40  Past Medical History: Past Medical History:  Diagnosis Date   Anemia    Arthritis    Complication of anesthesia    broke out with laughing gas    Dysrhythmia    pvc's adn Avc's   GERD (gastroesophageal reflux disease)    Obesity    PVC's (premature ventricular contractions)     Past Surgical History: Past Surgical History:  Procedure Laterality Date   CESAREAN SECTION  1996 1999   COLONOSCOPY     LUMBAR LAMINECTOMY/DECOMPRESSION MICRODISCECTOMY Left 03/28/2018   Procedure: Microlumbar decompression Lumbar five-Sacral one left;  Surgeon: Jene Every, MD;  Location: [facility] OR;  Service: Orthopedics;  Laterality: Left;    Current Medications: Current Meds  Medication Sig   cetirizine (ZYRTEC) 10 MG tablet Take 10 mg by mouth daily as needed for allergies.   Cholecalciferol (VITAMIN D3) 1.25 MG (50000 UT) TABS Take by mouth once a week. Everday Friday in the morning   Cyanocobalamin ([employer] B12 GUMMIES) 500 MCG CHEW Chew 500 mcg by mouth. Every other day   cycloSPORINE (RESTASIS) 0.05 % ophthalmic emulsion Place 1 drop into both eyes at bedtime.    diclofenac Sodium ([employer]S DICLOFENAC SODIUM) 1 % GEL  Apply topically 2 (two) times daily.   docusate sodium (COLACE) 100 MG capsule Take 1 capsule (100 mg total) by mouth 2 (two) times daily.   fluticasone (FLONASE) 50 MCG/ACT nasal spray Place 1 spray into both nostrils daily.   Lactobacillus-Inulin (CULTURELLE DIGESTIVE DAILY PO) Take by mouth.   Multiple Vitamin (MULTIVITAMIN WITH MINERALS) TABS tablet Take 1 tablet by mouth daily. [employer] WOMEN'S MULTIVITAMIN PLUS   Omega-3 Fatty Acids (FISH OIL) 1200 MG CAPS Fish Oil 1,200 mg-144 mg-216 mg capsule   propranolol ER (INDERAL LA) 120 MG 24 hr capsule Take 1 capsule (120 mg total) by mouth daily.   Psyllium (METAMUCIL) 28.3 % POWD    pyridOXINE (VITAMIN B-6) 100 MG tablet Take 100 mg by mouth daily.   Zinc 50 MG CAPS Take 50 mg  by mouth daily.   [DISCONTINUED] propranolol ER (INDERAL LA) 160 MG SR capsule Take 160 mg by mouth at bedtime.      Allergies:    Ivp dye [iodinated contrast media], Other, Oxybutynin, Oxycodone, Diltiazem, Feldene [piroxicam], Latex, and Neosporin [neomycin-bacitracin zn-polymyx]   Social History: Social History   Socioeconomic History   Marital status: Married    Spouse name: Not on file   Number of children: 2   Years of education: Not on file   Highest education level: Not on file  Occupational History   Occupation: Part time ArchitectChurch Secretary  Tobacco Use   Smoking status: Never   Smokeless tobacco: Never  Vaping Use   Vaping Use: Never used  Substance and Sexual Activity   Alcohol use: Not Currently    Alcohol/week: 0.0 standard drinks   Drug use: Never   Sexual activity: Not on file  Other Topics Concern   Not on file  Social History Narrative   Not on file   Social Determinants of Health   Financial Resource Strain: Not on file  Food Insecurity: Not on file  Transportation Needs: Not on file  Physical Activity: Not on file  Stress: Not on file  Social Connections: Not on file     Family History: The patient's family history includes Lung cancer in her father.  ROS:   All other ROS reviewed and negative. Pertinent positives noted in the HPI.     EKGs/Labs/Other Studies Reviewed:   The following studies were personally reviewed by me today:  EKG:  EKG is ordered today.  The ekg ordered today demonstrates normal sinus rhythm heart rate 61, no acute ischemic changes or evidence of infarction, and was personally reviewed by me.   Zio 05/2016 Normal sinus rhythm. Occasional PVCs with a total of 155 beat in 48 hours of presenting 0.1%. 7 beat run of slow ventricular tachycardia slightly above 100 beat per minute.   TTE 05/16/2016 - Left ventricle: Abnormal septal motion. The cavity size was    mildly dilated. Wall thickness was normal. Systolic function was     normal. The estimated ejection fraction was in the range of 50%    to 55%. Left ventricular diastolic function parameters were    normal.  - Left atrium: The atrium was mildly dilated.  - Atrial septum: No defect or patent foramen ovale was identified.  - Pericardium, extracardiac: A trivial pericardial effusion was    identified posterior to the heart.   NM Stress 06/22/2016 Normal resting and stress perfusion. No ischemia or infarction EF 55% Breast Attenuation   Recent Labs: 04/01/2021: BUN 21; Creatinine, Ser 0.87; Hemoglobin 11.5; Platelets 281; Potassium 3.8; Sodium 139  Recent Lipid Panel No results found for: CHOL, TRIG, HDL, CHOLHDL, VLDL, LDLCALC, LDLDIRECT  Physical Exam:   VS:  BP 106/62    Pulse 61    Ht 5' 6.5" (1.689 m)    Wt 251 lb 12.8 oz (114.2 kg)    SpO2 98%    BMI 40.03 kg/m    Wt Readings from Last 3 Encounters:  01/05/22 251 lb 12.8 oz (114.2 kg)  04/29/21 250 lb (113.4 kg)  04/01/21 275 lb 9.2 oz (125 kg)    General: Well nourished, well developed, in no acute distress Head: Atraumatic, normal size  Eyes: PEERLA, EOMI  Neck: Supple, no JVD Endocrine: No thryomegaly Cardiac: Normal S1, S2; RRR; no murmurs, rubs, or gallops Lungs: Clear to auscultation bilaterally, no wheezing, rhonchi or rales  Abd: Soft, nontender, no hepatomegaly  Ext: No edema, pulses 2+ Musculoskeletal: No deformities, BUE and BLE strength normal and equal Skin: Warm and dry, no rashes   Neuro: Alert and oriented to person, place, time, and situation, CNII-XII grossly intact, no focal deficits  Psych: Normal mood and affect   ASSESSMENT:   Juanna CaoLaura Jane Apple is a 56 y.o. female who presents for the following: 1. Preoperative cardiovascular examination   2. PVC (premature ventricular contraction)   3. Chest pain of uncertain etiology     PLAN:   1. Preoperative cardiovascular examination -RCRI equals 0 which equates to a perioperative risk of major cardiovascular  complication of 0.4% (very low risk). -She can complete greater than 4 METS without any significant symptoms.  Her chest pain symptoms are musculoskeletal. -Cardiovascular examination is normal.  EKG is normal.  I see no need for further cardiac testing.  She may proceed to surgery at acceptable risk.  2. PVC (premature ventricular contraction) -She reports symptoms from her PVCs.  Symptoms occur every 4 to 6 weeks.  Well-controlled on propranolol extended release 160 daily.  She does describe some dizziness and lightheadedness.  I believe she is on too much medication.  BP in office 106/62.  We discussed reducing the dose down to 120 mg daily.  She is okay to do this.  She is reluctant to go lower.  We will see how she does with this dose.  She will see us yearly.  3. Chest pain of uncertain etiology -She describes palpable chest pain.  This occurs with tight fitting bras.  I suspect this is musculoskeletal.  EKG is normal.  Stress test and echo in the past have been normal.  No need for further cardiac evaluation at this time.  Disposition: Return in about 1 year (around 01/05/2023).  Medication Adjustments/Labs and Tests Ordered: Current medicines are reviewed at length with the patient today.  Concerns regarding medicines are outlined above.  Orders Placed This Encounter  Procedures   EKG 12-Lead   Meds ordered this encounter  Medications   propranolol ER (INDERAL LA) 120 MG 24 hr capsule    Sig: Take 1 capsule (120 mg total) by mouth daily.    Dispense:  90 capsule    Refill:  1    Patient Instructions  Medication Instructions:  Decrease Propranolol to 120 XR Daily   *If you need a refill on your cardiac medications before your next appointment, please call your pharmacy*  Follow-Up: At [facility], you and your health needs are our priority.  As part of our continuing mission to provide you with exceptional heart care, we have created designated Provider Care Teams.  These  Care  Teams include your primary Cardiologist (physician) and Advanced Practice Providers (APPs -  Physician Assistants and Nurse Practitioners) who all work together to provide you with the care you need, when you need it.  We recommend signing up for the patient portal called "MyChart".  Sign up information is provided on this After Visit Summary.  MyChart is used to connect with patients for Virtual Visits (Telemedicine).  Patients are able to view lab/test results, encounter notes, upcoming appointments, etc.  Non-urgent messages can be sent to your provider as well.   To learn more about what you can do with MyChart, go to [email or web address].    Your next appointment:   12 month(s)  The format for your next appointment:   In Person  Provider:   Lennie Odor, MD or [facility] PA-C, or Azalee Course, PA-C       Signed, Lenna Gilford. Flora Lipps, MD, [facility]   [facility]  [address] [place], [place] [address] [phone]  01/05/2022 4:55 PM

## 2022-01-05 ENCOUNTER — Encounter: Payer: Self-pay | Admitting: Cardiovascular Disease

## 2022-01-05 ENCOUNTER — Other Ambulatory Visit: Payer: Self-pay

## 2022-01-05 ENCOUNTER — Ambulatory Visit: Payer: BC Managed Care – PPO | Admitting: Cardiovascular Disease

## 2022-01-05 VITALS — BP 106/62 | HR 61 | Ht 66.5 in | Wt 251.8 lb

## 2022-01-05 DIAGNOSIS — R079 Chest pain, unspecified: Secondary | ICD-10-CM | POA: Diagnosis not present

## 2022-01-05 DIAGNOSIS — Z01810 Encounter for preprocedural cardiovascular examination: Secondary | ICD-10-CM

## 2022-01-05 DIAGNOSIS — I493 Ventricular premature depolarization: Secondary | ICD-10-CM

## 2022-01-05 MED ORDER — PROPRANOLOL HCL ER 120 MG PO CP24: 120.0000 mg | ORAL_CAPSULE | Freq: Every day | ORAL | 1 refills | Status: DC

## 2022-01-05 NOTE — Patient Instructions (Signed)
Medication Instructions:  Decrease Propranolol to 120 XR Daily   *If you need a refill on your cardiac medications before your next appointment, please call your pharmacy*  Follow-Up: At [facility], you and your health needs are our priority.  As part of our continuing mission to provide you with exceptional heart care, we have created designated Provider Care Teams.  These Care Teams include your primary Cardiologist (physician) and Advanced Practice Providers (APPs -  Physician Assistants and Nurse Practitioners) who all work together to provide you with the care you need, when you need it.  We recommend signing up for the patient portal called "MyChart".  Sign up information is provided on this After Visit Summary.  MyChart is used to connect with patients for Virtual Visits (Telemedicine).  Patients are able to view lab/test results, encounter notes, upcoming appointments, etc.  Non-urgent messages can be sent to your provider as well.   To learn more about what you can do with MyChart, go to [email or web address].    Your next appointment:   12 month(s)  The format for your next appointment:   In Person  Provider:   Lennie Odor, MD or [facility] PA-C, or Azalee Course, [place]

## 2022-01-09 DIAGNOSIS — E559 Vitamin D deficiency, unspecified: Secondary | ICD-10-CM | POA: Diagnosis not present

## 2022-01-09 DIAGNOSIS — E538 Deficiency of other specified B group vitamins: Secondary | ICD-10-CM | POA: Diagnosis not present

## 2022-01-13 DIAGNOSIS — M25561 Pain in right knee: Secondary | ICD-10-CM | POA: Diagnosis not present

## 2022-01-13 DIAGNOSIS — M1711 Unilateral primary osteoarthritis, right knee: Secondary | ICD-10-CM | POA: Diagnosis not present

## 2022-01-13 DIAGNOSIS — M25661 Stiffness of right knee, not elsewhere classified: Secondary | ICD-10-CM | POA: Diagnosis not present

## 2022-01-17 ENCOUNTER — Ambulatory Visit: Payer: BC Managed Care – PPO | Admitting: Cardiovascular Disease

## 2022-01-25 ENCOUNTER — Telehealth: Payer: Self-pay | Admitting: Cardiovascular Disease

## 2022-01-25 MED ORDER — PROPRANOLOL HCL ER 120 MG PO CP24: 120.0000 mg | ORAL_CAPSULE | Freq: Every day | ORAL | 1 refills | Status: DC

## 2022-01-25 NOTE — Telephone Encounter (Signed)
°*  STAT* If patient is at the pharmacy, call can be transferred to refill team.   1. Which medications need to be refilled? (please list name of each medication and dose if known) propranolol ER (INDERAL LA) 120 MG 24 hr capsule  2. Which pharmacy/location (including street and city if local pharmacy) is medication to be sent to? [facility] [phone]  3. Do they need a 30 day or 90 day supply? 90 day  Has 8 days left.

## 2022-01-25 NOTE — Telephone Encounter (Signed)
Refills has been sent to the pharmacy. 

## 2022-02-09 DIAGNOSIS — M1711 Unilateral primary osteoarthritis, right knee: Secondary | ICD-10-CM | POA: Diagnosis not present

## 2022-03-28 DIAGNOSIS — R300 Dysuria: Secondary | ICD-10-CM | POA: Diagnosis not present

## 2022-03-28 DIAGNOSIS — Z96651 Presence of right artificial knee joint: Secondary | ICD-10-CM | POA: Diagnosis not present

## 2022-03-28 DIAGNOSIS — R197 Diarrhea, unspecified: Secondary | ICD-10-CM | POA: Diagnosis not present

## 2022-03-28 DIAGNOSIS — N3001 Acute cystitis with hematuria: Secondary | ICD-10-CM | POA: Diagnosis not present

## 2022-04-13 DIAGNOSIS — B3731 Acute candidiasis of vulva and vagina: Secondary | ICD-10-CM | POA: Diagnosis not present

## 2022-04-13 DIAGNOSIS — N3000 Acute cystitis without hematuria: Secondary | ICD-10-CM | POA: Diagnosis not present

## 2022-05-01 DIAGNOSIS — R300 Dysuria: Secondary | ICD-10-CM | POA: Diagnosis not present

## 2022-05-01 DIAGNOSIS — Z01411 Encounter for gynecological examination (general) (routine) with abnormal findings: Secondary | ICD-10-CM | POA: Diagnosis not present

## 2022-05-01 DIAGNOSIS — Z6839 Body mass index (BMI) 39.0-39.9, adult: Secondary | ICD-10-CM | POA: Diagnosis not present

## 2022-05-08 DIAGNOSIS — K219 Gastro-esophageal reflux disease without esophagitis: Secondary | ICD-10-CM | POA: Diagnosis not present

## 2022-05-08 DIAGNOSIS — R5382 Chronic fatigue, unspecified: Secondary | ICD-10-CM | POA: Diagnosis not present

## 2022-05-08 DIAGNOSIS — M25561 Pain in right knee: Secondary | ICD-10-CM | POA: Diagnosis not present

## 2022-05-08 DIAGNOSIS — K581 Irritable bowel syndrome with constipation: Secondary | ICD-10-CM | POA: Diagnosis not present

## 2022-05-08 DIAGNOSIS — Z1321 Encounter for screening for nutritional disorder: Secondary | ICD-10-CM | POA: Diagnosis not present

## 2022-05-08 DIAGNOSIS — H6123 Impacted cerumen, bilateral: Secondary | ICD-10-CM | POA: Diagnosis not present

## 2022-06-21 DIAGNOSIS — B372 Candidiasis of skin and nail: Secondary | ICD-10-CM | POA: Diagnosis not present

## 2022-06-30 DIAGNOSIS — M545 Low back pain, unspecified: Secondary | ICD-10-CM | POA: Diagnosis not present

## 2022-07-03 ENCOUNTER — Other Ambulatory Visit: Payer: Self-pay | Admitting: Cardiovascular Disease

## 2022-07-03 DIAGNOSIS — Z6841 Body Mass Index (BMI) 40.0 and over, adult: Secondary | ICD-10-CM | POA: Diagnosis not present

## 2022-07-14 DIAGNOSIS — Z1231 Encounter for screening mammogram for malignant neoplasm of breast: Secondary | ICD-10-CM | POA: Diagnosis not present

## 2022-07-17 DIAGNOSIS — H16223 Keratoconjunctivitis sicca, not specified as Sjogren's, bilateral: Secondary | ICD-10-CM | POA: Diagnosis not present

## 2022-08-14 DIAGNOSIS — Z96651 Presence of right artificial knee joint: Secondary | ICD-10-CM | POA: Diagnosis not present

## 2022-08-14 DIAGNOSIS — Z1151 Encounter for screening for human papillomavirus (HPV): Secondary | ICD-10-CM | POA: Diagnosis not present

## 2022-08-14 DIAGNOSIS — R102 Pelvic and perineal pain: Secondary | ICD-10-CM | POA: Diagnosis not present

## 2022-08-14 DIAGNOSIS — Z01419 Encounter for gynecological examination (general) (routine) without abnormal findings: Secondary | ICD-10-CM | POA: Diagnosis not present

## 2022-08-14 DIAGNOSIS — R5382 Chronic fatigue, unspecified: Secondary | ICD-10-CM | POA: Diagnosis not present

## 2022-08-14 DIAGNOSIS — N950 Postmenopausal bleeding: Secondary | ICD-10-CM | POA: Diagnosis not present

## 2022-08-14 DIAGNOSIS — Z124 Encounter for screening for malignant neoplasm of cervix: Secondary | ICD-10-CM | POA: Diagnosis not present

## 2022-08-14 DIAGNOSIS — E559 Vitamin D deficiency, unspecified: Secondary | ICD-10-CM | POA: Diagnosis not present

## 2022-08-14 DIAGNOSIS — K219 Gastro-esophageal reflux disease without esophagitis: Secondary | ICD-10-CM | POA: Diagnosis not present

## 2022-08-15 DIAGNOSIS — N939 Abnormal uterine and vaginal bleeding, unspecified: Secondary | ICD-10-CM | POA: Diagnosis not present

## 2022-09-07 DIAGNOSIS — L600 Ingrowing nail: Secondary | ICD-10-CM | POA: Diagnosis not present

## 2022-09-08 DIAGNOSIS — M25561 Pain in right knee: Secondary | ICD-10-CM | POA: Diagnosis not present

## 2022-10-26 ENCOUNTER — Telehealth: Payer: Self-pay | Admitting: *Deleted

## 2022-10-26 ENCOUNTER — Encounter: Payer: Self-pay | Admitting: *Deleted

## 2022-10-26 NOTE — Patient Instructions (Signed)
Visit Information  Thank you for taking time to visit with me today. Please don't hesitate to contact me if I can be of assistance to you.   Following are the goals we discussed today:   Goals Addressed               This Visit's Progress     COMPLETED: Stiffness recent knee surgery (pt-stated)        Care Coordination Interventions: Advised patient to continue to recommended exercises provided by her physical therapist to avoid ongoing stiffness by perform ROM and mobility exercises. Pt has contact her provided who has indicated relayed the same advise Reviewed medications with patient and discussed adherence to all prescribed medications Reviewed scheduled/upcoming provider appointments including pending appointment with sufficient transportation source Screening for signs and symptoms of depression related to chronic disease state  Assessed social determinant of health barriers Pt aware to apply hot/cold packets when needed and take Ibuprofen as directed.         Please call the care guide team at [phone] if you need to cancel or reschedule your appointment.   If you are experiencing a Mental Health or Behavioral Health Crisis or need someone to talk to, please call the Suicide and Crisis Lifeline: 988  The patient verbalized understanding of instructions, educational materials, and care plan provided today and agreed to receive a mailed copy of patient instructions, educational materials, and care plan.   No further follow up required: No further needs at this time   Elliot Cousin, RN Care Management Coordinator [facility] Main Office [phone]

## 2022-10-26 NOTE — Patient Outreach (Signed)
  Care Coordination   Initial Visit Note   10/26/2022 Name: Joda Braatz MRN: [id number] DOB: [birth year]  Araseli Sherry is a 56 y.o. year old female who sees Lenell Antu, DO for primary care. I spoke with  [name] Byrd by phone today.  What matters to the patients health and wellness today?  Stiffness to knee    Goals Addressed               This Visit's Progress     COMPLETED: Stiffness recent knee surgery (pt-stated)        Care Coordination Interventions: Advised patient to continue to recommended exercises provided by her physical therapist to avoid ongoing stiffness by perform ROM and mobility exercises. Pt has contact her provided who has indicated relayed the same advise Reviewed medications with patient and discussed adherence to all prescribed medications Reviewed scheduled/upcoming provider appointments including pending appointment with sufficient transportation source Screening for signs and symptoms of depression related to chronic disease state  Assessed social determinant of health barriers Pt aware to apply hot/cold packets when needed and take Ibuprofen as directed.         SDOH assessments and interventions completed:  Yes  SDOH Interventions Today    Flowsheet Row Most Recent Value  SDOH Interventions   Food Insecurity Interventions Intervention Not Indicated  Housing Interventions Intervention Not Indicated  Transportation Interventions Intervention Not Indicated  Utilities Interventions Intervention Not Indicated        Care Coordination Interventions Activated:  Yes  Care Coordination Interventions:  Yes, provided   Follow up plan: No further intervention required.   Encounter Outcome:  Pt. Visit Completed   Elliot Cousin, RN Care Management Coordinator [facility] Main Office [phone]

## 2023-01-08 DIAGNOSIS — K580 Irritable bowel syndrome with diarrhea: Secondary | ICD-10-CM | POA: Insufficient documentation

## 2023-01-25 NOTE — Progress Notes (Signed)
Cardiology Clinic Note   Patient Name: Dorothy Gibson Date of Encounter: 01/26/2023  Primary Care Provider:  Chesley Noon, MD Primary Cardiologist:  Evalina Field, MD  Patient Profile    Dorothy Gibson 57 year old female presents to the clinic today for follow-up evaluation of her chest discomfort.  Past Medical History    Past Medical History:  Diagnosis Date   Anemia    Arthritis    Complication of anesthesia    broke out with laughing gas    Dysrhythmia    pvc's adn Avc's   GERD (gastroesophageal reflux disease)    Obesity    PVC's (premature ventricular contractions)    Past Surgical History:  Procedure Laterality Date   CESAREAN SECTION  1996 1999   COLONOSCOPY     LUMBAR LAMINECTOMY/DECOMPRESSION MICRODISCECTOMY Left 03/28/2018   Procedure: Microlumbar decompression Lumbar five-Sacral one left;  Surgeon: Susa Day, MD;  Location: [facility];  Service: Orthopedics;  Laterality: Left;    Allergies  Allergies  Allergen Reactions   Ivp Dye [Iodinated Contrast Media] Anaphylaxis and Palpitations   Other Dermatitis    Paper tape or bandaids causes skin redness/blotchiness Patient states she tolerates adhesive tape   Oxybutynin     Unknown   Oxycodone Nausea And Vomiting   Diltiazem Rash and Other (See Comments)    Rash and constipation    Feldene [Piroxicam] Rash   Latex Rash   Neosporin [Neomycin-Bacitracin Zn-Polymyx] Rash and Other (See Comments)    Red/puffiness/skin irritation.    History of Present Illness    Dorothy Gibson has a PMH of PVCs, hyperlipidemia, and obesity.  She was seen by Dr. Audie Box on 01/05/2022.  During that time she presented for preoperative cardiac evaluation.  She was noted to have a normal stress test and echocardiogram previously.  She has been treated with propranolol for PVCs.  She has not seen cardiology in approximately 6 years.  She had been taking propranolol extended release 160 mg daily.  She did  note some dizziness and lightheadedness.  Her blood pressure was low.  She had also been treated for dizziness and inner ear infection.  Dizziness was discussed as it could be related to propranolol.  Decreasing her dose to 120 mg daily was recommended and she approved of this.  She reported chest discomfort.  She described it as chest tightness and compared it to tight fitting bras.  It was felt that her pain was MSK in nature.  She denied chest pain with exertion.  She denied significant shortness of breath.  She was limited in her physical activity due to right knee arthritis.  She denied smoking drug and alcohol use.  She denied strong family history of heart disease.  Her LDL cholesterol was noted to be 131.  Her A1c was noted to be 63.  She was able to complete activities around her house.  She was able to walk around without physical limitations.  Her EKG showed sinus rhythm.  She reported noticing palpitations/PVCs every 4-6 weeks.  She indicated that her propranolol was not helping her PVCs.  She presents to the clinic today for follow-up evaluation and states she feels well.  She has occasional episodes of palpitations that last for seconds and dissipate without intervention.  She also notices occasional episodes of chest discomfort along her left pectoral muscle.  These have improved with increasing her bra size.  She continues with her 120 mg of propranolol.  Her blood pressure today  is 94/64.  She reports occasional episodes of dizziness with standing.  I encouraged her to increase her p.o. hydration.  I will provide her with a lab slip for [facility] to have her lipids drawn.  Will plan follow-up in 1 year.  Today she denies chest pain, shortness of breath, lower extremity edema, fatigue, palpitations, melena, hematuria, hemoptysis, diaphoresis, weakness, presyncope, syncope, orthopnea, and PND.   Home Medications    Prior to Admission medications   Medication Sig Start Date End Date Taking?  Authorizing Provider  cetirizine (ZYRTEC) 10 MG tablet Take 10 mg by mouth daily as needed for allergies.    [provider]  Cholecalciferol (VITAMIN D3) 1.25 MG (50000 UT) TABS Take by mouth once a week. Everday Friday in the morning    [provider]  Cyanocobalamin (B-12) 2500 MCG SUBL Place 2,500 mcg under the tongue daily. Patient not taking: Reported on 01/05/2022    [provider]  Cyanocobalamin ([employer] B12 GUMMIES) 500 MCG CHEW Chew 500 mcg by mouth. Every other day    [provider]  cycloSPORINE (RESTASIS) 0.05 % ophthalmic emulsion Place 1 drop into both eyes at bedtime.     [provider]  diclofenac Sodium ([employer]S DICLOFENAC SODIUM) 1 % GEL Apply topically 2 (two) times daily.    [provider]  docusate sodium (COLACE) 100 MG capsule Take 1 capsule (100 mg total) by mouth 2 (two) times daily. 03/29/18   Cecilie Kicks, PA-C  fluticasone (FLONASE) 50 MCG/ACT nasal spray Place 1 spray into both nostrils daily.    [provider]  Lactobacillus-Inulin (CULTURELLE DIGESTIVE DAILY PO) Take by mouth.    [provider]  Multiple Vitamin (MULTIVITAMIN WITH MINERALS) TABS tablet Take 1 tablet by mouth daily. [place] MULTIVITAMIN PLUS    [provider]  Omega-3 Fatty Acids (FISH OIL) 1200 MG CAPS Fish Oil 1,200 mg-144 mg-216 mg capsule    [provider]  propranolol ER (INDERAL LA) 120 MG 24 hr capsule TAKE 1 CAPSULE DAILY 07/04/22   O'Neal, Cassie Freer, MD  Psyllium (METAMUCIL) 28.3 % POWD     [provider]  pyridOXINE (VITAMIN B-6) 100 MG tablet Take 100 mg by mouth daily.    [provider]  Zinc 50 MG CAPS Take 50 mg by mouth daily.    [provider]    Family History    Family History  Problem Relation Age of Onset   Lung cancer Father    She indicated that her mother is deceased. She indicated that her father is deceased. She indicated that her maternal  grandmother is deceased. She indicated that her maternal grandfather is deceased. She indicated that her paternal grandmother is deceased. She indicated that her paternal grandfather is deceased.  Social History    Social History   Socioeconomic History   Marital status: Married    Spouse name: Not on file   Number of children: 2   Years of education: Not on file   Highest education level: Not on file  Occupational History   Occupation: Part time Solicitor  Tobacco Use   Smoking status: Never   Smokeless tobacco: Never  Vaping Use   Vaping Use: Never used  Substance and Sexual Activity   Alcohol use: Not Currently    Alcohol/week: 0.0 standard drinks of alcohol   Drug use: Never   Sexual activity: Not on file  Other Topics Concern   Not on file  Social History Narrative  Not on file   Social Determinants of Health   Financial Resource Strain: Not on file  Food Insecurity: No Food Insecurity (10/26/2022)   Hunger Vital Sign    Worried About Running Out of Food in the Last Year: Never true    Ran Out of Food in the Last Year: Never true  Transportation Needs: No Transportation Needs (10/26/2022)   PRAPARE - Hydrologist (Medical): No    Lack of Transportation (Non-Medical): No  Physical Activity: Not on file  Stress: Not on file  Social Connections: Not on file  Intimate Partner Violence: Not on file     Review of Systems    General:  No chills, fever, night sweats or weight changes.  Cardiovascular:  No chest pain, dyspnea on exertion, edema, orthopnea, palpitations, paroxysmal nocturnal dyspnea. Dermatological: No rash, lesions/masses Respiratory: No cough, dyspnea Urologic: No hematuria, dysuria Abdominal:   No nausea, vomiting, diarrhea, bright red blood per rectum, melena, or hematemesis Neurologic:  No visual changes, wkns, changes in mental status. All other systems reviewed and are otherwise negative except as noted  above.  Physical Exam    VS:  BP 94/64   Pulse 65   Ht 5' 6.75" (1.695 m)   Wt 258 lb (117 kg)   SpO2 95%   BMI 40.71 kg/m  , BMI Body mass index is 40.71 kg/m. GEN: Well nourished, well developed, in no acute distress. HEENT: normal. Neck: Supple, no JVD, carotid bruits, or masses. Cardiac: RRR, no murmurs, rubs, or gallops. No clubbing, cyanosis, edema.  Radials/DP/PT 2+ and equal bilaterally.  Respiratory:  Respirations regular and unlabored, clear to auscultation bilaterally. GI: Soft, nontender, nondistended, BS + x 4. MS: no deformity or atrophy. Skin: warm and dry, no rash. Neuro:  Strength and sensation are intact. Psych: Normal affect.  Accessory Clinical Findings    Recent Labs: No results found for requested labs within last 365 days.   Recent Lipid Panel No results found for: "CHOL", "TRIG", "HDL", "CHOLHDL", "VLDL", "LDLCALC", "LDLDIRECT"       ECG personally reviewed by me today-sinus bradycardia no ectopy 57 bpm.- No acute changes  Echocardiogram 05/16/2016  Study Conclusions   - Left ventricle: Abnormal septal motion. The cavity size was    mildly dilated. Wall thickness was normal. Systolic function was    normal. The estimated ejection fraction was in the range of 50%    to 55%. Left ventricular diastolic function parameters were    normal.  - Left atrium: The atrium was mildly dilated.  - Atrial septum: No defect or patent foramen ovale was identified.  - Pericardium, extracardiac: A trivial pericardial effusion was    identified posterior to the heart.   Transthoracic echocardiography.  M-mode, complete 2D, spectral  Doppler, and color Doppler.  Birthdate:  Patient birthdate:  06/15/1966.  Age:  Patient is 57 yr old.  Sex:  Gender: female.  BMI: 40.7 kg/m^2.  Blood pressure:     105/68  Patient status:  Outpatient.  Study date:  Study date: 05/16/2016. Study time: 10:56  AM.  Location:  [facility]    -------------------------------------------------------------------   -------------------------------------------------------------------  Left ventricle:  Abnormal septal motion. The cavity size was mildly  dilated. Wall thickness was normal. Systolic function was normal.  The estimated ejection fraction was in the range of 50% to 55%. The  transmitral flow pattern was normal. The deceleration time of the  early transmitral flow velocity was normal. The  pulmonary vein flow  pattern was normal. The tissue Doppler parameters were normal. Left  ventricular diastolic function parameters were normal.   -------------------------------------------------------------------  Aortic valve:   Structurally normal valve.   Cusp separation was  normal.  Doppler:  Transvalvular velocity was within the normal  range. There was no stenosis. There was no regurgitation.   -------------------------------------------------------------------  Aorta: The aorta was normal, not dilated, and non-diseased.   -------------------------------------------------------------------  Mitral valve:   Doppler:  There was trivial regurgitation.   -------------------------------------------------------------------  Left atrium:  The atrium was mildly dilated.   -------------------------------------------------------------------  Atrial septum:  No defect or patent foramen ovale was identified.    -------------------------------------------------------------------  Right ventricle:  The cavity size was normal. Wall thickness was  normal. Systolic function was normal.   -------------------------------------------------------------------  Pulmonic valve:    Doppler:  There was trivial regurgitation.   -------------------------------------------------------------------  Tricuspid valve:   Doppler:  There was mild regurgitation.   -------------------------------------------------------------------  Right atrium:  The  atrium was normal in size.   -------------------------------------------------------------------  Pericardium: A trivial pericardial effusion was identified  posterior to the heart.   -------------------------------------------------------------------  Systemic veins:  Inferior vena cava: The vessel was normal in size. The  respirophasic diameter changes were in the normal range (>= 50%),  consistent with normal central venous pressure.   -------------------------------------------------------------------  Post procedure conclusions  Ascending Aorta:   - The aorta was normal, not dilated, and non-diseased.    Assessment & Plan   1.  Chest discomfort-no chest pain today.  No recent episodes of chest pressure or discomfort.  Somewhat physically active.  Previous normal stress test and echocardiogram.  Previously felt to be related to musculoskeletal discomfort/pain. No plans for ischemic evaluation at this time. Increase physical activity as tolerated Heart healthy low-sodium diet  PVCs-EKG today shows sinus bradycardia.  Noting palpitations less frequently.  Compliant with propranolol. Increase physical activity as tolerated Avoid triggers caffeine, chocolate, EtOH, dehydration etc. Continue propranolol  Hyperlipidemia-no recent labs.  Will give lab slip for [facility] in [place]. Continue omega-3 fatty acids Heart healthy low-sodium high-fiber diet Follows with PCP  Disposition: Follow-up with Dr. Audie Box or me in 1 year.   Jossie Ng. Jacques Fife NP-C     01/26/2023, 2:42 PM [place] [address] Office [phone] Fax [phone]    I spent 14 minutes examining this patient, reviewing medications, and using patient centered shared decision making involving her cardiac care.  Prior to her visit I spent greater than 20 minutes reviewing her past medical history,  medications, and prior cardiac tests.

## 2023-01-26 ENCOUNTER — Encounter: Payer: Self-pay | Admitting: General Practice

## 2023-01-26 ENCOUNTER — Other Ambulatory Visit: Payer: Self-pay

## 2023-01-26 ENCOUNTER — Ambulatory Visit: Payer: BC Managed Care – PPO | Attending: General Practice | Admitting: General Practice

## 2023-01-26 VITALS — BP 94/64 | HR 65 | Ht 66.75 in | Wt 258.0 lb

## 2023-01-26 DIAGNOSIS — I493 Ventricular premature depolarization: Secondary | ICD-10-CM | POA: Diagnosis not present

## 2023-01-26 DIAGNOSIS — E782 Mixed hyperlipidemia: Secondary | ICD-10-CM | POA: Diagnosis not present

## 2023-01-26 DIAGNOSIS — R079 Chest pain, unspecified: Secondary | ICD-10-CM

## 2023-01-26 NOTE — Patient Instructions (Addendum)
Medication Instructions:  Your physician recommends that you continue on your current medications as directed. Please refer to the Current Medication list given to you today.  *If you need a refill on your cardiac medications before your next appointment, please call your pharmacy*  Please increase fluid intake Please increase physical activity as tolerated  Lab Work: Your physician recommends that you return at your earliest convenience to have the following labs drawn: Lipid Profile and Lft's. If you have labs (blood work) drawn today and your tests are completely normal, you will receive your results only by: [place] (if you have MyChart) OR A paper copy in the mail If you have any lab test that is abnormal or we need to change your treatment, we will call you to review the results.   Testing/Procedures: NONE   Follow-Up: At [facility], you and your health needs are our priority.  As part of our continuing mission to provide you with exceptional heart care, we have created designated Provider Care Teams.  These Care Teams include your primary Cardiologist (physician) and Advanced Practice Providers (APPs -  Physician Assistants and Nurse Practitioners) who all work together to provide you with the care you need, when you need it.  We recommend signing up for the patient portal called "MyChart".  Sign up information is provided on this After Visit Summary.  MyChart is used to connect with patients for Virtual Visits (Telemedicine).  Patients are able to view lab/test results, encounter notes, upcoming appointments, etc.  Non-urgent messages can be sent to your provider as well.   To learn more about what you can do with MyChart, go to [email or web address].    Your next appointment:   1 year(s)  Provider:   Evalina Field, MD

## 2023-06-27 ENCOUNTER — Other Ambulatory Visit: Payer: Self-pay | Admitting: Cardiovascular Disease

## 2023-06-27 NOTE — Progress Notes (Deleted)
Name: Dorothy Gibson DOB: 02-21-1966 MRN: [id number]  History of Present Illness: Dorothy Gibson is a 57 y.o. female who presents today as a new patient at [facility].  She reports chief complaint of gross hematuria. She was seen for this problem on 05/29/2023 at PCP office.  - Per that note: "1 day of light pink urine. She has had recurrent urinary tract infections since a right total knee replacement on 02/09/2022. Most recently in May she has been treated multiple times most recently with Macrobid and Cipro by her OB/GYN. She also has some right lower quadrant abdominal pain. No previous urology evaluation." - "UA is positive for leukocytes and red blood cells. Organ to culture this urine. Rocephin 1 g IM injection given in office. Septra prescribed for oral administration." - Urine culture came back negative.    Today: She {Actions; denies-reports:120008} ongoing gross hematuria.   She {Actions; denies-reports:120008} urinary urgency, frequency, dysuria, straining to void, or sensations of incomplete emptying. She {Actions; denies-reports:120008} ***abdominal or ***flank pain. She {Actions; denies-reports:120008} fevers.  She {Actions; denies-reports:120008} history of kidney stones.  She {Actions; denies-reports:120008} history of pyelonephritis.  She {Actions; denies-reports:120008} history of recent or recurrent UTI. She {Actions; denies-reports:120008} history of GU malignancy or pelvic radiation.  She {Actions; denies-reports:120008} history of autoimmune disease. She {Actions; denies-reports:120008} history of smoking (quit***; smoked*** ppd x*** years). She {Actions; denies-reports:120008} known occupational risks. She {Actions; denies-reports:120008} recent vigorous exercise which they think may be contributory to hematuria. She {Actions; denies-reports:120008} any recent trauma or prolonged pressure to the perineal area. She {Actions;  denies-reports:120008} recent illness. She {Actions; denies-reports:120008} taking anticoagulants (***).  Fall Screening: Do you usually have a device to assist in your mobility? {yes/no:20286} ***cane / ***walker / ***wheelchair  Medications: Current Outpatient Medications  Medication Sig Dispense Refill   dicyclomine (BENTYL) 10 MG capsule Take 10 mg by mouth 4 (four) times daily as needed for spasms.     docusate sodium (COLACE) 100 MG capsule Take 1 capsule (100 mg total) by mouth 2 (two) times daily. 40 capsule 0   ergocalciferol (VITAMIN D2) 1.25 MG (50000 UT) capsule Take 50,000 Units by mouth once a week.     famotidine (PEPCID) 20 MG tablet Take 20 mg by mouth daily.     Lactobacillus-Inulin (CULTURELLE DIGESTIVE DAILY PO) Take by mouth.     Multiple Vitamin (MULTIVITAMIN WITH MINERALS) TABS tablet Take 1 tablet by mouth daily. [employer] WOMEN'S MULTIVITAMIN PLUS     NYSTATIN powder Apply 1 Application topically 2 (two) times daily. to affected area     propranolol ER (INDERAL LA) 120 MG 24 hr capsule TAKE 1 CAPSULE DAILY 90 capsule 3   triamcinolone cream (KENALOG) 0.1 % Apply 1 Application topically 2 (two) times daily.     No current facility-administered medications for this visit.    Allergies: Allergies  Allergen Reactions   Ivp Dye [Iodinated Contrast Media] Anaphylaxis and Palpitations   Other Dermatitis    Paper tape or bandaids causes skin redness/blotchiness Patient states she tolerates adhesive tape   Oxybutynin     Unknown   Oxycodone Nausea And Vomiting   Diltiazem Rash and Other (See Comments)    Rash and constipation    Feldene [Piroxicam] Rash   Latex Rash   Neosporin [Neomycin-Bacitracin Zn-Polymyx] Rash and Other (See Comments)    Red/puffiness/skin irritation.    Past Medical History:  Diagnosis Date   Anemia    Arthritis    Complication of anesthesia  broke out with laughing gas    Dysrhythmia    pvc's adn Avc's   GERD (gastroesophageal  reflux disease)    Obesity    PVC's (premature ventricular contractions)    Past Surgical History:  Procedure Laterality Date   CESAREAN SECTION  1996 1999   COLONOSCOPY     LUMBAR LAMINECTOMY/DECOMPRESSION MICRODISCECTOMY Left 03/28/2018   Procedure: Microlumbar decompression Lumbar five-Sacral one left;  Surgeon: Jene Every, MD;  Location: [facility];  Service: Orthopedics;  Laterality: Left;   Family History  Problem Relation Age of Onset   Lung cancer Father    Social History   Socioeconomic History   Marital status: Married    Spouse name: Not on file   Number of children: 2   Years of education: Not on file   Highest education level: Not on file  Occupational History   Occupation: Part time Architect  Tobacco Use   Smoking status: Never   Smokeless tobacco: Never  Vaping Use   Vaping Use: Never used  Substance and Sexual Activity   Alcohol use: Not Currently    Alcohol/week: 0.0 standard drinks of alcohol   Drug use: Never   Sexual activity: Not on file  Other Topics Concern   Not on file  Social History Narrative   Not on file   Social Determinants of Health   Financial Resource Strain: Not on file  Food Insecurity: No Food Insecurity (10/26/2022)   Hunger Vital Sign    Worried About Running Out of Food in the Last Year: Never true    Ran Out of Food in the Last Year: Never true  Transportation Needs: No Transportation Needs (10/26/2022)   PRAPARE - Administrator, Civil Service (Medical): No    Lack of Transportation (Non-Medical): No  Physical Activity: Not on file  Stress: Not on file  Social Connections: Not on file  Intimate Partner Violence: Not on file    SUBJECTIVE  Review of Systems Constitutional: Patient ***denies any unintentional weight loss or change in strength lntegumentary: Patient ***denies any rashes or pruritus Eyes: Patient denies ***dry eyes ENT: Patient ***denies dry mouth Cardiovascular: Patient ***denies  chest pain or syncope Respiratory: Patient ***denies shortness of breath Gastrointestinal: Patient ***denies nausea, vomiting, constipation, or diarrhea Musculoskeletal: Patient ***denies muscle cramps or weakness Neurologic: Patient ***denies convulsions or seizures Psychiatric: Patient ***denies memory problems Allergic/Immunologic: Patient ***denies recent allergic reaction(s) Hematologic/Lymphatic: Patient denies bleeding tendencies Endocrine: Patient ***denies heat/cold intolerance  GU: As per HPI.  OBJECTIVE There were no vitals filed for this visit. There is no height or weight on file to calculate BMI.  Physical Examination  Constitutional: ***No obvious distress; patient is ***non-toxic appearing  Cardiovascular: ***No visible lower extremity edema.  Respiratory: The patient does ***not have audible wheezing/stridor; respirations do ***not appear labored  Gastrointestinal: Abdomen ***non-distended Musculoskeletal: ***Normal ROM of UEs  Skin: ***No obvious rashes/open sores  Neurologic: CN 2-12 grossly ***intact Psychiatric: Answered questions ***appropriately with ***normal affect  Hematologic/Lymphatic/Immunologic: ***No obvious bruises or sites of spontaneous bleeding  UA: {Desc; negative/positive:13464} for *** WBC/hpf, *** RBC/hpf, bacteria (***) *** nitrites, *** leukocytes, *** blood PVR: *** ml  ASSESSMENT No diagnosis found.  For management of gross hematuria, we discussed possible etiologies including but not limited to: vigorous exercise, sexual activity, stone, trauma, blood thinner use, urinary tract infection, kidney function, ***BPH, ***radiation cystitis, malignancy. ***We discussed pt's smoking as a risk factor for GU cancer and encouraged ***continued smoking cessation.***  We discussed the  importance of work-up including assessing the upper and lower GU tract with CT urogram and cystoscopy. We will also check ***CMP, ***CBC, and ***voided cytology.  We  discussed the risk for clot retention and pt was advised to increase fluid intake to thin out clots. Pt was advised to go to the ER if they become unable to urinate due to clot retention, start having symptoms of anemia (which were discussed), or any other significant concerning acute symptoms.  Pt verbalized understanding and decided to pursue this work-up. Patient agreed to follow-up afterward to discuss the results and formulate a treatment plan based on the findings. All questions were answered.   PLAN Advised the following: CT ***ordered. Labs (CBC, CMP, ***PSA) ordered. Voided cytology ***ordered. 4. ***No follow-ups on file.  No orders of the defined types were placed in this encounter.   It has been explained that the patient is to follow regularly with their PCP in addition to all other providers involved in their care and to follow instructions provided by these respective offices. Patient advised to contact urology clinic if any urologic-pertaining questions, concerns, new symptoms or problems arise in the interim period.  There are no Patient Instructions on file for this visit.  Electronically signed by:  Donnita Falls, MSN, FNP-C, CUNP 06/27/2023 9:21 AM

## 2023-06-29 ENCOUNTER — Ambulatory Visit: Payer: BC Managed Care – PPO | Admitting: Urology

## 2023-06-29 DIAGNOSIS — R310 Gross hematuria: Secondary | ICD-10-CM

## 2023-07-24 ENCOUNTER — Ambulatory Visit: Payer: BC Managed Care – PPO | Admitting: Urology

## 2023-07-30 ENCOUNTER — Encounter: Payer: Self-pay | Admitting: Urology

## 2023-07-30 ENCOUNTER — Ambulatory Visit: Payer: BC Managed Care – PPO | Admitting: Urology

## 2023-07-30 VITALS — BP 110/78 | HR 70 | Temp 98.1°F

## 2023-07-30 DIAGNOSIS — N3946 Mixed incontinence: Secondary | ICD-10-CM

## 2023-07-30 DIAGNOSIS — R351 Nocturia: Secondary | ICD-10-CM

## 2023-07-30 DIAGNOSIS — R310 Gross hematuria: Secondary | ICD-10-CM | POA: Diagnosis not present

## 2023-07-30 DIAGNOSIS — Z87440 Personal history of urinary (tract) infections: Secondary | ICD-10-CM | POA: Insufficient documentation

## 2023-07-30 DIAGNOSIS — R300 Dysuria: Secondary | ICD-10-CM

## 2023-07-30 DIAGNOSIS — N952 Postmenopausal atrophic vaginitis: Secondary | ICD-10-CM

## 2023-07-30 LAB — URINALYSIS, ROUTINE W REFLEX MICROSCOPIC
Bilirubin, UA: NEGATIVE
Glucose, UA: NEGATIVE
Ketones, UA: NEGATIVE
Leukocytes,UA: NEGATIVE
Nitrite, UA: NEGATIVE
Protein,UA: NEGATIVE
RBC, UA: NEGATIVE
Specific Gravity, UA: 1.03 (ref 1.005–1.030)
Urobilinogen, Ur: 0.2 mg/dL (ref 0.2–1.0)
pH, UA: 6 (ref 5.0–7.5)

## 2023-07-30 NOTE — Progress Notes (Signed)
Name: Dorothy Gibson DOB: [birth year] MRN: [id number]  History of Present Illness: Dorothy Gibson is a 57 y.o. female who presents today as a new patient at [facility].  She reports chief complaint of gross hematuria.  Recent history:  > 05/29/2023: Seen at PCP office for gross hematuria.  - Per that note: "1 day of light pink urine. She has had recurrent urinary tract infections since a right total knee replacement on 02/09/2022. Most recently in May she has been treated multiple times most recently with Macrobid and Cipro by her OB/GYN. She also has some right lower quadrant abdominal pain. No previous urology evaluation." - "UA is positive for leukocytes and red blood cells. Organ to culture this urine. Rocephin 1 g IM injection given in office. Septra prescribed for oral administration." - Urine culture came back negative.  > 07/06/2023:  - Negative urine culture.  - Normal renal function (GFR 92; creatinine 0.75). - Normal CBC.  Today:  She reports that following her knee replacement in February 2023 she had recurrent UTIs through May 2024.   She reports gross hematuria was first noticed in early June 2024 and lasted for a few days. She denies prior history of gross hematuria.   She reports feeling well today - denies gross hematuria, dysuria, increased urinary urgency, frequency, nocturia, hesitancy, straining to void, or sensations of incomplete emptying. She reports urge and stress urinary incontinence. Wears 1 pad per day.   She reports persistent intermittent RLQ abdominal pain which is described as sharp and brief.  She denies history of kidney stones.  She denies history of GU malignancy or pelvic radiation.  She denies history of autoimmune disease. She distant history of light smoking (a few cigarettes a week in her high school & college years). She denies known occupational risks. She denies taking anticoagulants.  She reports that she was  previously prescribed Premarin topical vaginal estrogen cream by her GYN provider for vaginal atrophy but that she stopped it because it was so uncomfortable.    Fall Screening: Do you usually have a device to assist in your mobility? No   Medications: Current Outpatient Medications  Medication Sig Dispense Refill   Cholecalciferol (VITAMIN D3) 50 MCG (2000 UT) capsule      dicyclomine (BENTYL) 10 MG capsule Take 10 mg by mouth 4 (four) times daily as needed for spasms.     docusate sodium (COLACE) 100 MG capsule Take 1 capsule (100 mg total) by mouth 2 (two) times daily. 40 capsule 0   gabapentin (NEURONTIN) 100 MG capsule Take 100 mg by mouth 2 (two) times daily.     Lactobacillus-Inulin (CULTURELLE DIGESTIVE DAILY PO) Take by mouth.     methocarbamol (ROBAXIN) 500 MG tablet Take 500 mg by mouth every 8 (eight) hours as needed for muscle spasms.     Multiple Vitamin (MULTIVITAMIN WITH MINERALS) TABS tablet Take 1 tablet by mouth daily. [employer] WOMEN'S MULTIVITAMIN PLUS     mupirocin ointment (BACTROBAN) 2 %      NYSTATIN powder Apply 1 Application topically 2 (two) times daily. to affected area     propranolol ER (INDERAL LA) 120 MG 24 hr capsule TAKE 1 CAPSULE DAILY 90 capsule 2   calcium carbonate (ANTACID EXTRA STRENGTH) 750 MG chewable tablet      cycloSPORINE (RESTASIS) 0.05 % ophthalmic emulsion      famotidine (PEPCID) 20 MG tablet Take 20 mg by mouth daily.     triamcinolone cream (KENALOG) 0.1 %  Apply 1 Application topically 2 (two) times daily.     No current facility-administered medications for this visit.    Allergies: Allergies  Allergen Reactions   Ivp Dye [Iodinated Contrast Media] Anaphylaxis and Palpitations   Other Dermatitis    Paper tape or bandaids causes skin redness/blotchiness Patient states she tolerates adhesive tape   Oxybutynin     Unknown   Oxycodone Nausea And Vomiting   Diltiazem Rash and Other (See Comments)    Rash and constipation    Feldene  [Piroxicam] Rash   Latex Rash   Neosporin [Neomycin-Bacitracin Zn-Polymyx] Rash and Other (See Comments)    Red/puffiness/skin irritation.    Past Medical History:  Diagnosis Date   Anemia    Arthritis    Complication of anesthesia    broke out with laughing gas    Dysrhythmia    pvc's adn Avc's   GERD (gastroesophageal reflux disease)    Obesity    PVC's (premature ventricular contractions)    Past Surgical History:  Procedure Laterality Date   CESAREAN SECTION  1996 1999   COLONOSCOPY     LUMBAR LAMINECTOMY/DECOMPRESSION MICRODISCECTOMY Left 03/28/2018   Procedure: Microlumbar decompression Lumbar five-Sacral one left;  Surgeon: Jene Every, MD;  Location: [facility] OR;  Service: Orthopedics;  Laterality: Left;   Family History  Problem Relation Age of Onset   Lung cancer Father    Social History   Socioeconomic History   Marital status: Married    Spouse name: Not on file   Number of children: 2   Years of education: Not on file   Highest education level: Not on file  Occupational History   Occupation: Part time Architect  Tobacco Use   Smoking status: Never   Smokeless tobacco: Never  Vaping Use   Vaping status: Never Used  Substance and Sexual Activity   Alcohol use: Not Currently    Alcohol/week: 0.0 standard drinks of alcohol   Drug use: Never   Sexual activity: Not on file  Other Topics Concern   Not on file  Social History Narrative   Not on file   Social Determinants of Health   Financial Resource Strain: Patient Declined (02/08/2023)   Received from [facility], [facility]   Overall Financial Resource Strain (CARDIA)    Difficulty of Paying Living Expenses: Patient declined  Food Insecurity: Patient Declined (02/08/2023)   Received from [facility], [facility]   Hunger Vital Sign    Worried About Running Out of Food in the Last Year: Patient declined    Ran Out of Food in the Last Year: Patient declined  Transportation Needs: No  Transportation Needs (02/08/2023)   Received from [employer], [facility]   PRAPARE - Transportation    Lack of Transportation (Medical): No    Lack of Transportation (Non-Medical): No  Physical Activity: Inactive (02/08/2023)   Received from [facility], [facility]   Exercise Vital Sign    Days of Exercise per Week: 0 days    Minutes of Exercise per Session: 30 min  Stress: No Stress Concern Present (02/08/2023)   Received from [facility], [facility] - Occupational Stress Questionnaire    Feeling of Stress : Only a little  Social Connections: Moderately Integrated (02/08/2023)   Received from [facility], [facility]   Social Network    How would you rate your social network (family, work, friends)?: Adequate participation with social networks  Intimate Partner Violence: Not  At Risk (02/08/2023)   Received from [facility], [facility]   HITS    Over the last 12 months how often did your partner physically hurt you?: 1    Over the last 12 months how often did your partner insult you or talk down to you?: 1    Over the last 12 months how often did your partner threaten you with physical harm?: 1    Over the last 12 months how often did your partner scream or curse at you?: 1    SUBJECTIVE  Review of Systems Constitutional: Patient denies any unintentional weight loss or change in strength lntegumentary: Patient denies any rashes or pruritus Cardiovascular: Patient denies chest pain or syncope Respiratory: Patient denies shortness of breath Gastrointestinal: Patient denies nausea, vomiting, constipation, or diarrhea Musculoskeletal: Patient denies muscle cramps or weakness Neurologic: Patient denies convulsions or seizures Psychiatric: Patient denies memory problems Allergic/Immunologic: Patient denies recent allergic reaction(s) Hematologic/Lymphatic: Patient denies bleeding tendencies Endocrine: Patient denies  heat/cold intolerance  GU: As per HPI.  OBJECTIVE Vitals:   07/30/23 1017  BP: 110/78  Pulse: 70  Temp: 98.1 F (36.7 C)   There is no height or weight on file to calculate BMI.  Physical Examination  Constitutional: No obvious distress; patient is non-toxic appearing  Cardiovascular: No visible lower extremity edema.  Respiratory: The patient does not have audible wheezing/stridor; respirations do not appear labored  Gastrointestinal: Abdomen non-distended Musculoskeletal: Normal ROM of UEs  Skin: No obvious rashes/open sores  Neurologic: CN 2-12 grossly intact Psychiatric: Answered questions appropriately with normal affect  Hematologic/Lymphatic/Immunologic: No obvious bruises or sites of spontaneous bleeding  UA: negative  PVR: 0 ml  ASSESSMENT Gross hematuria - Plan: Urinalysis, Routine w reflex microscopic, Cytology, urine, BLADDER SCAN AMB NON-IMAGING, US RENAL, DG Abd 1 View  Dysuria - Plan: US RENAL, DG Abd 1 View  Nocturia - Plan: US RENAL, DG Abd 1 View  History of recurrent UTIs  Mixed stress and urge urinary incontinence  Atrophic vaginitis  1. Gross hematuria. We discussed possible etiologies including but not limited to: vigorous exercise, sexual activity, stone, trauma, blood thinner use, urinary tract infection, kidney function, malignancy.   We discussed the importance of work-up including assessing the upper and lower GU tract for further evaluation. Will check voided cytology. Advised cystoscopy. Given low suspicion for malignancy and patient allergy to IV contrast dye, advised RUS & KUB at this time instead of CT urogram.   2. Persistent intermittent RLQ abdominal pain. We discussed further evaluation with RUS & KUB. Low suspicion for GU etiology. Discussed alternate possible etiologies including but not limited to: GYN issue, constipation, muscle strain, hernia.  3. History of recurrent UTIs. Appears resolved per past 2 urine cultures. Will request  prior urine cultures for review.  4. Vaginal atrophy. Advised to revisit this with her GYN provider as management of vaginal atrophy can be an important aspect of recurrent UTI prevention in postmenopausal women.   5. Mixed urinary incontinence. Minimally bothersome; we agreed to address this in more detail at follow up. For now she was advised to work on timed voiding & caffeine reduction.  Pt verbalized understanding and decided to pursue this work-up. Patient agreed to follow-up afterward to discuss the results and formulate a treatment plan based on the findings. All questions were answered.   PLAN Advised the following: RUS & KUB. Voided cytology. Requesting prior urine culture results from GYN. Work on timed voiding & caffeine reduction. Discuss vaginal atrophy management  with GYN provider.  6. Return for RUS, KUB, and 1st available cystoscopy with Dr. Ronne Binning.  Orders Placed This Encounter  Procedures   US RENAL    Standing Status:   Future    Standing Expiration Date:   07/29/2024    Order Specific Question:   Reason for Exam (SYMPTOM  OR DIAGNOSIS REQUIRED)    Answer:   history of hematuria; allergies to IV contrast dye    Order Specific Question:   Preferred imaging location?    Answer:   [facility]   DG Abd 1 View    Standing Status:   Future    Standing Expiration Date:   07/29/2024    Order Specific Question:   Reason for Exam (SYMPTOM  OR DIAGNOSIS REQUIRED)    Answer:   history of hematuria; allergies to IV contrast dye    Order Specific Question:   Is patient pregnant?    Answer:   No    Order Specific Question:   Preferred imaging location?    Answer:   [facility]   Urinalysis, Routine w reflex microscopic   BLADDER SCAN AMB NON-IMAGING   It has been explained that the patient is to follow regularly with their PCP in addition to all other providers involved in their care and to follow instructions provided by these respective offices. Patient  advised to contact urology clinic if any urologic-pertaining questions, concerns, new symptoms or problems arise in the interim period.  There are no Patient Instructions on file for this visit.  Total time spent caring for the patient today was over 45 minutes. This includes time spent on the date of the visit reviewing the patient's chart before the visit, time spent during the visit, and time spent after the visit on documentation. Over 50% of that time was spent in face-to-face time with this patient for direct counseling. E&M based on time and complexity of medical decision making.   Electronically signed by:  Donnita Falls, MSN, FNP-C, CUNP 07/30/2023 12:45 PM

## 2023-07-30 NOTE — Progress Notes (Signed)
post void residual=7 

## 2023-08-03 ENCOUNTER — Telehealth: Payer: Self-pay

## 2023-08-03 NOTE — Telephone Encounter (Signed)
-----   Message from Donnita Falls sent at 08/03/2023  8:55 AM EDT ----- Please notify patient: Negative voided cytology - no malignant findings. Follow up as planned.

## 2023-08-03 NOTE — Telephone Encounter (Signed)
Detailed message left on patient voice mail as request in the active FYI of Maralyn Sago recommendation.

## 2023-10-09 ENCOUNTER — Telehealth: Payer: Self-pay

## 2023-10-09 NOTE — Telephone Encounter (Signed)
Patient left a voice message 10-09-2023.  Wanting to speak with a nurse. Needing to ask questions about CYSTO and Imaging.  Please advise.

## 2023-10-10 NOTE — Telephone Encounter (Signed)
Returned call to patient and she states she spoke with someone yesterday and had all her questions answered.

## 2023-10-12 ENCOUNTER — Ambulatory Visit (HOSPITAL_COMMUNITY)
Admission: RE | Admit: 2023-10-12 | Discharge: 2023-10-12 | Disposition: A | Discharge status: Home / Self Care | Payer: BC Managed Care – PPO | Source: Ambulatory Visit | Attending: Urology | Admitting: Urology

## 2023-10-12 DIAGNOSIS — R351 Nocturia: Secondary | ICD-10-CM | POA: Insufficient documentation

## 2023-10-12 DIAGNOSIS — R300 Dysuria: Secondary | ICD-10-CM | POA: Diagnosis present

## 2023-10-12 DIAGNOSIS — R310 Gross hematuria: Secondary | ICD-10-CM | POA: Insufficient documentation

## 2023-10-15 ENCOUNTER — Other Ambulatory Visit: Payer: BC Managed Care – PPO | Admitting: Urology

## 2023-10-22 NOTE — Progress Notes (Signed)
Letter sent.

## 2023-11-07 ENCOUNTER — Telehealth: Payer: Self-pay | Admitting: Urology

## 2023-11-07 NOTE — Telephone Encounter (Signed)
Patient has question?  Does she need to have a cysto since all her test were normal and she hasn't had a UTI since September , everything seems to be doing better and she is drinking more water.  Can she cancel the Cysto for now?

## 2023-11-07 NOTE — Telephone Encounter (Signed)
Please see below and advise.  Do you still think she needs cysto appt.

## 2023-11-08 ENCOUNTER — Encounter: Payer: Self-pay | Admitting: Urology

## 2023-11-08 NOTE — Progress Notes (Signed)
Sent to staff on 11/08/2023 re: patient's inquiry about canceling cystoscopy scheduled with Dr. Ronne Binning on 11/26/2023:  Please let patient know:  The hematuria concern was based on PCP visit on 05/29/2023 when patient reported "1 day of light pink urine" and recurrent UTIs.   Urine culture results in past 12 months: - 04/23/2023: Positive for >100k E. coli - 05/02/2023: Positive for >100k E. coli - 05/07/2023: Positive for 10-25k E. coli - 05/29/2023: Negative  - 07/06/2023: Negative   I reviewed the records received from [facility] which did confirm history of what appears to have been a relapsing UTI in May 2024 requiring multiple rounds of antibiotics to clear the infection - no evidence of recurrent UTIs since all her positive urine cultures occurred within 1 month. No positive urine cultures / UTI since May 2024.   - 07/30/2023: Negative voided cytology (no evidence of malignant cells in her urine).   - 10/12/2023: Normal RUS & KUB (no GU stones, masses, or hydronephrosis;  bladder unremarkable).  At our initial visit on 07/30/2023 she reported that she had previously been prescribed Premarin topical vaginal estrogen cream by her GYN provider for vaginal atrophy but wasn't using it. The note by her PCP on 05/29/2023 described her hematuria as "1 day of light pink urine" - not as true gross hematuria.   Based on all the available information, I suspect that her episode of hematuria / "1 day of light pink urine" was due to her untreated vaginal atrophy with a small amount of blood caused by vaginal / urethral irritation and dryness. OK to cancel cystoscopy. As advised by her GYN provider and at our visit on 07/30/2023, she is recommended to use topical vaginal estrogen cream. Could discuss alternative forms of topical vaginal estrogen use with her GYN provider (such as Intrarosa or Estring) if she doesn't want to use the cream. Can follow up with Urology on a PRN  basis.  Evette Georges, MSN, FNP-C, [facility] Nurse Practitioner [facility] [place]

## 2023-11-09 NOTE — Telephone Encounter (Signed)
Left voicemail for patient to return call to office. 

## 2023-11-13 NOTE — Telephone Encounter (Signed)
Patient called with no answer. Message left to return call to office.

## 2023-11-14 NOTE — Telephone Encounter (Signed)
Patient called to inquire if cysto appointment is really needed since imaging was negative and she has not had any recent UTI's.  MD made aware and states cysto is not needed. Patient made aware of NP's last recommendations and voiced understanding. Cysto appointment cancelled and patient will follow up when needed.

## 2023-11-26 ENCOUNTER — Other Ambulatory Visit: Payer: BC Managed Care – PPO | Admitting: Urology

## 2023-12-24 ENCOUNTER — Telehealth: Payer: Self-pay | Admitting: Cardiovascular Disease

## 2023-12-24 NOTE — Telephone Encounter (Signed)
 Patient would like to switch from Dr. Flora Lipps to Dr. Anne Fu. Her husband sees Dr. Anne Fu so she would like to see the same provider as well. Are you both in agreement with this?

## 2023-12-26 NOTE — Telephone Encounter (Signed)
 Patient scheduled for 3/28 with Dr. Anne Fu.

## 2024-03-14 ENCOUNTER — Ambulatory Visit: Payer: BC Managed Care – PPO | Attending: Internal Medicine | Admitting: Cardiology

## 2024-03-14 ENCOUNTER — Encounter: Payer: Self-pay | Admitting: Cardiology

## 2024-03-14 VITALS — BP 114/72 | HR 59 | Ht 67.75 in | Wt 265.8 lb

## 2024-03-14 DIAGNOSIS — I10 Essential (primary) hypertension: Secondary | ICD-10-CM | POA: Diagnosis not present

## 2024-03-14 DIAGNOSIS — R002 Palpitations: Secondary | ICD-10-CM

## 2024-03-14 DIAGNOSIS — R0609 Other forms of dyspnea: Secondary | ICD-10-CM | POA: Diagnosis not present

## 2024-03-14 NOTE — Patient Instructions (Signed)
 Medication Instructions:  The current medical regimen is effective;  continue present plan and medications.  *If you need a refill on your cardiac medications before your next appointment, please call your pharmacy*  Testing/Procedures: Your physician has requested that you have an echocardiogram. Echocardiography is a painless test that uses sound waves to create images of your heart. It provides your doctor with information about the size and shape of your heart and how well your heart's chambers and valves are working. This procedure takes approximately one hour. There are no restrictions for this procedure. Please do NOT wear cologne, perfume, aftershave, or lotions (deodorant is allowed). Please arrive 15 minutes prior to your appointment time.  Please note: We ask at that you not bring children with you during ultrasound (echo/ vascular) testing. Due to room size and safety concerns, children are not allowed in the ultrasound rooms during exams. Our front office staff cannot provide observation of children in our lobby area while testing is being conducted. An adult accompanying a patient to their appointment will only be allowed in the ultrasound room at the discretion of the ultrasound technician under special circumstances. We apologize for any inconvenience.   Follow-Up: At [facility], you and your health needs are our priority.  As part of our continuing mission to provide you with exceptional heart care, our providers are all part of one team.  This team includes your primary Cardiologist (physician) and Advanced Practice Providers or APPs (Physician Assistants and Nurse Practitioners) who all work together to provide you with the care you need, when you need it.  Your next appointment:   Follow up will be based on the results of the above testing.   We recommend signing up for the patient portal called "MyChart".  Sign up information is provided on this After Visit  Summary.  MyChart is used to connect with patients for Virtual Visits (Telemedicine).  Patients are able to view lab/test results, encounter notes, upcoming appointments, etc.  Non-urgent messages can be sent to your provider as well.   To learn more about what you can do with MyChart, go to [email or web address].        1st Floor: - Lobby - Registration  - Pharmacy  - Lab - Cafe  2nd Floor: - PV Lab - Diagnostic Testing (echo, CT, nuclear med)  3rd Floor: - Vacant  4th Floor: - TCTS (cardiothoracic surgery) - AFib Clinic - Structural Heart Clinic - Vascular Surgery  - Vascular Ultrasound  5th Floor: - HeartCare Cardiology (general and EP) - Clinical Pharmacy for coumadin, hypertension, lipid, weight-loss medications, and med management appointments    Valet parking services will be available as well.

## 2024-03-14 NOTE — Progress Notes (Signed)
 Cardiology Office Note:  .   Date:  03/14/2024  ID:  Dorothy Gibson, DOB June 13, 1966, MRN [id number] PCP: Eartha Inch, MD  [facility] Providers Cardiologist:  Reatha Harps, MD     History of Present Illness: .   Dorothy Gibson is a 58 y.o. female Discussed the use of AI scribe software for clinical note transcription with the patient, who gave verbal consent to proceed.  History of Present Illness Dorothy Gibson is a 58 year old female with PVCs and musculoskeletal chest pain who presents with chest pain. She is accompanied by her husband, Onalee Hua.  She experiences occasional sharp, stabbing chest pains, the most recent occurring one to two weeks ago, which resolved spontaneously. A stress test in 2017 indicated low risk for ischemia, and an echocardiogram in 2017 showed an ejection fraction of 50-55%.  She has significant fatigue and shortness of breath, particularly when climbing stairs or walking on inclines. She describes being 'totally out of breath' after climbing 21 steps and needing to rest during walks in the park. There has been a noticeable decrease in her energy levels since undergoing knee replacement surgery in February 2023. She also experiences fatigue in the afternoons and falls asleep earlier in the evening than she used to. Her family has mentioned that she snores occasionally.  She experiences occasional palpitations, which are managed with propranolol, 160 mg extended release daily. She finds propranolol effective in controlling her symptoms, including PVCs.  She experiences leg swelling, particularly by the end of the day, which she manages with compression stockings. She has a history of a ruptured vein in her leg, which required emergency care. She also reports limping post-knee surgery, which exacerbates her back pain. She takes gabapentin 100 mg twice daily for back pain, which she attributes to limping.  She is currently taking  rosuvastatin for cholesterol management. Her recent lab results show an LDL of 42 mg/dL and triglycerides of 413 mg/dL. She also reports a history of high blood pressure and an A1c of 6.3%.  She has struggled with weight management and has tried various diets and nutritionists without success. She wants to lose weight and has previously lost weight with Weight [employer].  She has a history of IBS with diarrhea, which limits her ability to consume fruits and vegetables. She is allergic to most fruits, except for applesauce, which she can tolerate in small amounts.  She participates in a low-impact exercise class and attempts to walk regularly, which is beneficial for her overall health.      Studies Reviewed: Marland Kitchen   EKG Interpretation Date/Time:  Friday March 14 2024 15:34:04 EDT Ventricular Rate:  59 PR Interval:  134 QRS Duration:  84 QT Interval:  430 QTC Calculation: 425 R Axis:   -20  Text Interpretation: Sinus bradycardia Nonspecific ST abnormality When compared with ECG of 21-Mar-2018 14:43, No significant change was found Confirmed by Donato Schultz ([id number]) on 03/14/2024 3:38:38 PM    Results LABS LDL: 131 A1c: 6.3 Triglycerides: 246 Hb: 11.8 Cr: 0.75 Total cholesterol: 250  DIAGNOSTIC Echocardiogram: EF 50-55% (2017) Stress test: Low risk for ischemia (2017) Risk Assessment/Calculations:            Physical Exam:   VS:  BP 114/72   Pulse (!) 59   Ht 5' 7.75" (1.721 m)   Wt 265 lb 12.8 oz (120.6 kg)   SpO2 93%   BMI 40.71 kg/m    Wt Readings from Last 3 Encounters:  03/14/24 265 lb 12.8 oz (120.6 kg)  01/26/23 258 lb (117 kg)  01/05/22 251 lb 12.8 oz (114.2 kg)    GEN: Well nourished, well developed in no acute distress NECK: No JVD; No carotid bruits CARDIAC: RRR, no murmurs, no rubs, no gallops RESPIRATORY:  Clear to auscultation without rales, wheezing or rhonchi  ABDOMEN: Soft, non-tender, non-distended EXTREMITIES:  No edema; No deformity   ASSESSMENT  AND PLAN: .    Assessment and Plan Assessment & Plan Shortness of breath Significant dyspnea with minimal exertion since knee replacement surgery in February 2023. Differential diagnosis includes cardiac causes, such as heart failure or reduced ejection fraction, and non-cardiac causes, such as deconditioning or pulmonary issues. Previous echocardiogram in 2017 showed an ejection fraction of 50-55%, and a stress test at that time was low risk for ischemia. - Order echocardiogram to assess heart function and rule out cardiac causes.  Chest pain Occasional sharp, stabbing chest pain that resolves spontaneously. Previous evaluations suggested musculoskeletal pain, and a stress test in 2017 was low risk for ischemia. Propranolol helps with palpitations and PVCs. - Continue propranolol ER 120 mg at night for PVCs and palpitations. OK with PCP refilling.   Hyperlipidemia On rosuvastatin with LDL at 42 mg/dL, well-controlled, but triglycerides elevated at 246 mg/dL. Compliant with medication. Statins are safe and effective for cholesterol management. - Continue rosuvastatin for cholesterol management.  Obesity Difficulty with weight management despite various methods, including Weight Watchers and nutritionist consultations. Advised against diet pills due to potential cardiac side effects. Not diabetic, limiting access to medications like semaglutide. Discussed alternatives like Ozempic and Wegovy, which are expensive and typically prescribed for diabetes. Encouraged lifestyle modifications, including diet and exercise. - Encourage lifestyle modifications, including diet and exercise.  Irritable Bowel Syndrome (IBS) Symptoms consistent with IBS, including diarrhea triggered by certain foods, particularly fruits, complicating dietary management for weight loss. - Provide dietary guidance to manage IBS symptoms.  Follow-up Echocardiogram results will guide further follow-up. - Notify of  echocardiogram results. - Arrange follow-up based on echocardiogram findings.         Signed, Donato Schultz, MD

## 2024-03-24 ENCOUNTER — Other Ambulatory Visit: Payer: Self-pay | Admitting: Cardiovascular Disease

## 2024-04-18 ENCOUNTER — Ambulatory Visit (HOSPITAL_COMMUNITY): Attending: Cardiology

## 2024-04-18 DIAGNOSIS — R002 Palpitations: Secondary | ICD-10-CM | POA: Diagnosis not present

## 2024-04-18 DIAGNOSIS — R0609 Other forms of dyspnea: Secondary | ICD-10-CM | POA: Diagnosis not present

## 2024-04-18 LAB — ECHOCARDIOGRAM COMPLETE
Area-P 1/2: 2.93 cm2
Est EF: 55
S' Lateral: 3.6 cm
